# Patient Record
Sex: Female | Born: 1980 | Hispanic: Yes | Smoking: Never smoker
Health system: Southern US, Community
[De-identification: ages and names within clinical notes are randomized; demographics above are authoritative.]

## PROBLEM LIST (undated history)

## (undated) DIAGNOSIS — D649 Anemia, unspecified: Secondary | ICD-10-CM

## (undated) DIAGNOSIS — E119 Type 2 diabetes mellitus without complications: Secondary | ICD-10-CM

## (undated) DIAGNOSIS — Z5189 Encounter for other specified aftercare: Secondary | ICD-10-CM

## (undated) HISTORY — DX: Anemia, unspecified: D64.9

---

## 2005-02-16 ENCOUNTER — Ambulatory Visit (HOSPITAL_COMMUNITY): Admission: RE | Admit: 2005-02-16 | Discharge: 2005-02-16 | Payer: Self-pay | Admitting: Obstetrics and Gynecology

## 2005-03-25 ENCOUNTER — Ambulatory Visit (HOSPITAL_COMMUNITY): Admission: RE | Admit: 2005-03-25 | Discharge: 2005-03-25 | Payer: Self-pay | Admitting: *Deleted

## 2005-06-02 ENCOUNTER — Inpatient Hospital Stay (HOSPITAL_COMMUNITY): Admission: AD | Admit: 2005-06-02 | Discharge: 2005-06-02 | Payer: Self-pay | Admitting: *Deleted

## 2005-06-02 ENCOUNTER — Ambulatory Visit: Payer: Self-pay | Admitting: Obstetrics & Gynecology

## 2005-07-04 ENCOUNTER — Ambulatory Visit: Payer: Self-pay | Admitting: *Deleted

## 2005-07-04 ENCOUNTER — Inpatient Hospital Stay (HOSPITAL_COMMUNITY): Admission: AD | Admit: 2005-07-04 | Discharge: 2005-07-04 | Payer: Self-pay | Admitting: *Deleted

## 2005-07-07 ENCOUNTER — Inpatient Hospital Stay (HOSPITAL_COMMUNITY): Admission: AD | Admit: 2005-07-07 | Discharge: 2005-07-11 | Payer: Self-pay | Admitting: Obstetrics & Gynecology

## 2005-07-07 ENCOUNTER — Ambulatory Visit: Payer: Self-pay | Admitting: Obstetrics & Gynecology

## 2005-07-08 ENCOUNTER — Encounter (INDEPENDENT_AMBULATORY_CARE_PROVIDER_SITE_OTHER): Payer: Self-pay | Admitting: *Deleted

## 2009-02-13 ENCOUNTER — Emergency Department (HOSPITAL_COMMUNITY): Admission: EM | Admit: 2009-02-13 | Discharge: 2009-02-13 | Payer: Self-pay | Admitting: Emergency Medicine

## 2010-03-26 ENCOUNTER — Ambulatory Visit (HOSPITAL_COMMUNITY)
Admission: RE | Admit: 2010-03-26 | Discharge: 2010-03-26 | Payer: Self-pay | Source: Home / Self Care | Attending: Obstetrics & Gynecology | Admitting: Obstetrics & Gynecology

## 2010-07-22 LAB — URINALYSIS, ROUTINE W REFLEX MICROSCOPIC
Bilirubin Urine: NEGATIVE
Glucose, UA: NEGATIVE mg/dL
Ketones, ur: NEGATIVE mg/dL
Leukocytes, UA: NEGATIVE
Nitrite: NEGATIVE
Protein, ur: NEGATIVE mg/dL
Specific Gravity, Urine: 1.021 (ref 1.005–1.030)
Urobilinogen, UA: 0.2 mg/dL (ref 0.0–1.0)
pH: 5.5 (ref 5.0–8.0)

## 2010-07-22 LAB — WET PREP, GENITAL
Trich, Wet Prep: NONE SEEN
WBC, Wet Prep HPF POC: NONE SEEN
Yeast Wet Prep HPF POC: NONE SEEN

## 2010-07-22 LAB — URINE MICROSCOPIC-ADD ON

## 2010-07-22 LAB — GC/CHLAMYDIA PROBE AMP, GENITAL
Chlamydia, DNA Probe: NEGATIVE
GC Probe Amp, Genital: NEGATIVE

## 2010-07-22 LAB — PREGNANCY, URINE: Preg Test, Ur: NEGATIVE

## 2010-07-26 ENCOUNTER — Other Ambulatory Visit: Payer: Self-pay | Admitting: Family Medicine

## 2010-07-26 DIAGNOSIS — O480 Post-term pregnancy: Secondary | ICD-10-CM

## 2010-07-28 ENCOUNTER — Other Ambulatory Visit: Payer: Self-pay | Admitting: Family Medicine

## 2010-07-28 ENCOUNTER — Ambulatory Visit (HOSPITAL_COMMUNITY)
Admission: RE | Admit: 2010-07-28 | Discharge: 2010-07-28 | Disposition: A | Discharge status: Home / Self Care | Payer: Self-pay | Source: Ambulatory Visit | Attending: Family Medicine | Admitting: Family Medicine

## 2010-07-28 DIAGNOSIS — O480 Post-term pregnancy: Secondary | ICD-10-CM

## 2010-07-29 ENCOUNTER — Other Ambulatory Visit: Payer: Self-pay | Admitting: Obstetrics & Gynecology

## 2010-07-29 ENCOUNTER — Inpatient Hospital Stay (HOSPITAL_COMMUNITY)
Admission: AD | Admit: 2010-07-29 | Discharge: 2010-08-01 | DRG: 766 | Disposition: A | Discharge status: Home / Self Care | Payer: Medicaid Other | Source: Ambulatory Visit | Attending: Obstetrics & Gynecology | Admitting: Obstetrics & Gynecology

## 2010-07-29 DIAGNOSIS — O34219 Maternal care for unspecified type scar from previous cesarean delivery: Principal | ICD-10-CM | POA: Diagnosis present

## 2010-07-29 LAB — CBC
HCT: 41 % (ref 36.0–46.0)
Hemoglobin: 13.9 g/dL (ref 12.0–15.0)
MCH: 32.6 pg (ref 26.0–34.0)
MCHC: 33.9 g/dL (ref 30.0–36.0)
MCV: 96 fL (ref 78.0–100.0)
Platelets: 192 10*3/uL (ref 150–400)
RBC: 4.27 MIL/uL (ref 3.87–5.11)
RDW: 13.7 % (ref 11.5–15.5)
WBC: 9.2 10*3/uL (ref 4.0–10.5)

## 2010-07-29 LAB — RPR: RPR Ser Ql: NONREACTIVE

## 2010-07-30 LAB — CBC
HCT: 34.1 % — ABNORMAL LOW (ref 36.0–46.0)
Hemoglobin: 11.3 g/dL — ABNORMAL LOW (ref 12.0–15.0)
MCH: 32.1 pg (ref 26.0–34.0)
MCHC: 33.1 g/dL (ref 30.0–36.0)
MCV: 96.9 fL (ref 78.0–100.0)
Platelets: 168 10*3/uL (ref 150–400)
RBC: 3.52 MIL/uL — ABNORMAL LOW (ref 3.87–5.11)
RDW: 13.8 % (ref 11.5–15.5)
WBC: 11.8 10*3/uL — ABNORMAL HIGH (ref 4.0–10.5)

## 2010-08-02 ENCOUNTER — Ambulatory Visit (HOSPITAL_COMMUNITY): Payer: Self-pay

## 2010-08-03 ENCOUNTER — Other Ambulatory Visit: Payer: Self-pay

## 2010-08-05 NOTE — Discharge Summary (Signed)
  Nancy Romero, Nancy Romero          ACCOUNT NO.:  [id number]  MEDICAL RECORD NO.:  [id number]           PATIENT TYPE:  I  LOCATION:  9111                          [facility]  PHYSICIAN:  Lesly Dukes, M.D. DATE OF BIRTH:  Mar 21, 1981  DATE OF ADMISSION:  07/29/2010 DATE OF DISCHARGE:  08/01/2010                              DISCHARGE SUMMARY   OPERATIVE SURGEON:  Horton Chin, MD  DISCHARGE DIAGNOSIS:  Term pregnancy with repeat low transverse C- section.  DISCHARGE MEDICATIONS:  Prenatal vitamins and Percocet for pain.  PROCEDURES PERFORMED:  Repeat low transverse C-section for nonreactive NST during labor.  COMPLICATIONS FROM SURGERY:  None.  HOSPITAL COURSE:  The patient is a 30 year old, G2, P 2-0-0-2 with previous C-section who came in labor at 44 weeks, was found to have nonreassuring nonstress test, and was brought for repeat C-section which was performed without complications.  Her postoperative and postpartum course were normal.  She is breast-feeding her baby without difficulty, using the bathroom.  She has staples which will be removed at [facility] on Wednesday, August 04, 2010.  She is complaining of pain which is responding to Percocet from.  She also has a firm fundus and minimal vaginal bleeding and is at home status.  Plans for contraception are vasectomy for her husband.  Follow up with [facility] in 6 weeks. Baby will follow up with [facility].  DISCHARGE ACTIVITY:  Ad lib.    ______________________________ Edd Arbour, MD   ______________________________ Lesly Dukes, M.D.    JO/MEDQ  D:  08/01/2010  T:  08/01/2010  Job:  [id number]  Electronically Signed by Edd Arbour MD on 08/03/2010 09:50:26 PM Electronically Signed by Elsie Lincoln M.D. on 08/05/2010 10:23:55 AM

## 2010-08-08 ENCOUNTER — Inpatient Hospital Stay (HOSPITAL_COMMUNITY)
Admission: AD | Admit: 2010-08-08 | Discharge: 2010-08-08 | Disposition: A | Discharge status: Home / Self Care | Payer: Self-pay | Source: Ambulatory Visit | Attending: Obstetrics & Gynecology | Admitting: Obstetrics & Gynecology

## 2010-08-08 DIAGNOSIS — O2390 Unspecified genitourinary tract infection in pregnancy, unspecified trimester: Secondary | ICD-10-CM

## 2010-08-08 DIAGNOSIS — B3789 Other sites of candidiasis: Secondary | ICD-10-CM | POA: Insufficient documentation

## 2010-08-08 DIAGNOSIS — R109 Unspecified abdominal pain: Secondary | ICD-10-CM | POA: Insufficient documentation

## 2010-08-08 LAB — URINALYSIS, ROUTINE W REFLEX MICROSCOPIC
Bilirubin Urine: NEGATIVE
Glucose, UA: NEGATIVE mg/dL
Ketones, ur: NEGATIVE mg/dL
Protein, ur: NEGATIVE mg/dL
Specific Gravity, Urine: 1.02 (ref 1.005–1.030)
pH: 6 (ref 5.0–8.0)

## 2010-08-08 LAB — URINE MICROSCOPIC-ADD ON

## 2010-08-08 LAB — CBC
HCT: 39.9 % (ref 36.0–46.0)
Hemoglobin: 13.5 g/dL (ref 12.0–15.0)
MCH: 32.8 pg (ref 26.0–34.0)
MCHC: 33.8 g/dL (ref 30.0–36.0)
MCV: 97.1 fL (ref 78.0–100.0)
Platelets: 294 10*3/uL (ref 150–400)
RBC: 4.11 MIL/uL (ref 3.87–5.11)
RDW: 13.3 % (ref 11.5–15.5)
WBC: 8.6 10*3/uL (ref 4.0–10.5)

## 2010-08-09 NOTE — Op Note (Signed)
NAMEAHRIANNA, SIGLIN          ACCOUNT NO.:  [id number]  MEDICAL RECORD NO.:  [id number]           PATIENT TYPE:  I  LOCATION:  9111                          [facility]  PHYSICIAN:  Horton Chin, MD DATE OF BIRTH:  [birth year]  DATE OF PROCEDURE:  07/29/2010 DATE OF DISCHARGE:                              OPERATIVE REPORT   PREOPERATIVE DIAGNOSES: 1. Intrauterine pregnancy at 71 and 4/7th weeks' gestation. 2. Nonreactive nonstress test. 3. Prior cesarean section. 4. The patient desires elective repeat cesarean section and declines     trial of labor after cesarean section.  POSTOPERATIVE DIAGNOSES: 1. Intrauterine pregnancy at 62 and 4/7th weeks' gestation. 2. Nonreactive nonstress test. 3. Prior cesarean section. 4. The patient desires elective repeat cesarean section and declines     trial of labor after cesarean section.  PROCEDURE:  Repeat low transverse cesarean section via Pfannenstiel incision.  SURGEON:  Horton Chin, MD  ANESTHESIOLOGIST:  Cristela Blue.  ANESTHESIA:  Spinal.  IV FLUIDS:  2100 mL of lactated Ringer.  ESTIMATED BLOOD LOSS:  500 mL.  URINE OUTPUT:  275 mL.  INDICATIONS:  The patient is a 30 year old gravida 2, para 1 at 40-4/7th week gestation who [facility].  The patient's prenatal course is remarkable for history of a prior cesarean section. The patient originally consented for a trial of labor after cesarean section.  However, she recently changed her mind and wanted an elective repeat cesarean section when [facility] her [facility] visits.  Also at the [facility], she was noted to have a nonreactive NST.  She was sent to MAU for further monitoring and after 2 hours of being on the monitor, she still did not have a reactive NST. Given that this was an indication to move towards delivery and the patient desired elective cesarean section, she was counseled  regarding the risks of the cesarean section.  These included but are not limited to: bleeding which might require transfusion, infection, which require antibiotics, injury to surrounding organs, need for additional procedures including hysterectomy in the event of life-threatening hemorrhage, thromboembolic phenomenon, incisional problems, abnormal presentation, and subsequent pregnancies and other postoperative anesthesia, complications written informed consent was obtained.  Of note, counseling was done with the help of a Spanish interpreter.  FINDINGS:  Viable female infant in cephalic presentation.  Apgars were 9 and 9 weight 8 pounds and 6 ounces.  There was clear amniotic fluid, intact placenta with three-vessel cord.  Normal uterus and bilateral adnexa.  SPECIMENS:  Placenta was sent to Pathology.  COMPLICATIONS:  None immediate.  PROCEDURE DETAIL:  The patient was taken to the operating room where spinal analgesia was administered and found to be adequate.  She was then placed in a dorsal supine position with a leftward tilt, and prepped and draped in a sterile manner.  Sequential compression devices were applied to her lower extremities and a Foley catheter was inserted into the patient's bladder and attached to constant gravity.  The patient also received preoperative antibiotics.  After an adequate time-out was performed a Pfannenstiel incision was made over her  preexisting scar and taken down to the layer of fascia.  This fascial incision was incised bilaterally.  Kochers were placed on both sides and the fascial incision on the underlying rectus muscle was dissected off sharply and bluntly. The rectus muscles were separated in the midline, bluntly and sharply and the peritoneum was also entered.  This incision was extended superiorly and inferiorly with good visualization of bowel and bladder.   Attention was turned to the patient's lower uterine segment where a  transverse hysterotomy was made and extended in a blunt fashion. Amniotic fluid membranes were ruptured for clear fluid.  The infant's head was encountered and delivered atraumatically.  The rest of the infant was also delivered using fundal pressure.  Cord was clamped and cut, and the infant was handed over to the awaiting Neonatology team. Cord blood was collected according to the protocol.  Fundal massage was administered and the placenta delivered intact with three-vessel cord. The uterus was cleared of all clots and debris using dry laparotomy sponges.  The hysterotomy was repaired using 0 Monocryl in a running interlocking fashion.  A second layer 0 Monocryl was used for an imbricating layer.  Good hemostasis was noted.  The pelvis and gutters were cleared of all clot and debris.  The muscles and peritoneum were closed in a mass fashion using 3 interrupted sutures of 0 Monocryl.  The fascia was reapproximated using 0 PDS in a running fashion.  The subcutaneous layer was irrigated and reapproximated using 3 interrupted stitches of 2-0 plain gut, and the skin was closed with 3-0 Vicryl subcuticular stitch.  The patient tolerated the procedure well.  Sponge, instrument, and needle counts were correct x2.  She was taken to recovery room in a stable condition.     Horton Chin, MD     UAA/MEDQ  D:  07/29/2010  T:  07/30/2010  Job:  [id number]  Electronically Signed by Jaynie Collins MD on 08/09/2010 12:13:30 PM

## 2010-09-03 NOTE — Op Note (Signed)
Nancy Romero, Nancy Romero               ACCOUNT NO.:  [id number]   MEDICAL RECORD NO.:  [id number]          PATIENT TYPE:  INP   LOCATION:  9121                          [facility]   PHYSICIAN:  Lesly Dukes, M.D. DATE OF BIRTH:  1981-02-25   DATE OF PROCEDURE:  07/08/2005  DATE OF DISCHARGE:                                 OPERATIVE REPORT   PREOPERATIVE DIAGNOSES:  1.  Intrauterine pregnancy at 40 weeks and 3 days.  2.  Large for gestational age.  3.  Requesting primary cesarean section.   POSTOPERATIVE DIAGNOSES:  1.  Intrauterine pregnancy at 40 weeks and 3 days.  2.  Large for gestational age.  3.  Requesting primary cesarean section.   OPERATION/PROCEDURE:  Primary low transverse cesarean section via  Pfannenstiel.   SURGEON:  Lesly Dukes, M.D.   ASSISTANTKaroline Caldwell B. Merlene Morse, M.D.   ANESTHESIA:  Spinal.   COMPLICATIONS:  None.   ESTIMATED BLOOD LOSS:  600 mL.   FLUIDS:  3400 mL.   URINARY OUTPUT:  200 mL of clear urine at the end of the procedure.   INDICATIONS:  A 30 year old, gravida 1, para 0 at 40 weeks and 3 days with  nonreactive NST with LGA requesting primary cesarean section.   FINDINGS:  Female infant without presentation.  Terminal meconium.  Peds were  at delivery.  Apgars 8 and 9. Weight 8 pounds 5 ounces.  Normal uterus,  tubes and ovaries.  Hypospiral cord.  Cord pH 7.27.   DESCRIPTION OF PROCEDURE:  The patient was taken to the operating room where  spinal anesthesia was given and found to be adequate.  She was then prepped  and draped in the normal sterile fashion in the dorsal supine position with  a leftward tilt.  A Pfannenstiel skin incision was then made with the  scalpel and carried through to the underlying layer of fascia.  The fascia  was incised in the midline and incision extended laterally with the Mayo  scissors.  The superior aspect of the fascial incision was then grasped with  the Kocher clamps, elevated and the  underlying rectus muscle dissected off  bluntly.  Attention was then turned to the inferior aspect of the incision  which in a similar fashion was grasped, tented up with Kocher clamps and the  rectus muscle dissected off bluntly.  The rectus muscle was then separated  in the midline and the peritoneum identified, tented up and entered sharply  with the Metzenbaum scissors.  The peritoneal incision was extended  superiorly and inferiorly with good visualization of the bladder.  Bladder  blade was then inserted into the vesicouterine peritoneum, identified and  grasped with pickups and entered sharply with the Metzenbaum scissors.  Incision was extended laterally and the bladder flap created digitally.  The  bladder blade was then reinserted in the lower uterine segment, incised in  the transverse fashion with the scalpel.  The uterus was then extended  laterally with the bandage scissors.  Bladder blade was removed and the  infant's head delivered atraumatically.  The nose and mouth  were suctioned  with a bulb syringe.  The cord was clamped and cut.  The infant was handed  off to the awaiting pediatricians.  Cord blood were sent.  Cord pH 7.27.  Placenta was then removed manually.  A hypospiral cord was noted.  The  uterus was then exteriorized and cleared of all clots and debris.  The  uterine incision was repaired in a running locked fashion.  The gutters were  cleared off of all clots and the fascia was reapproximated in a running  fashion.  The skin was closed with staples.  The patient tolerated the  procedure well.  Sponge, lap and needle counts were correct x2.  Ancef 2 g  was given at cord clamp.  The patient was taken to the recovery room in  stable condition.     ______________________________  August Saucer Merlene Morse, MD    ______________________________  Lesly Dukes, M.D.    ABC/MEDQ  D:  07/08/2005  T:  07/11/2005  Job:  [id number]

## 2010-09-03 NOTE — Discharge Summary (Signed)
Nancy Romero, UPHAM               ACCOUNT NO.:  [id number]   MEDICAL RECORD NO.:  [id number]          PATIENT TYPE:  INP   LOCATION:  9121                          [facility]   PHYSICIAN:  Conni Elliot, M.D.DATE OF BIRTH:  05-17-1980   DATE OF ADMISSION:  07/07/2005  DATE OF DISCHARGE:  07/11/2005                                 DISCHARGE SUMMARY   DISCHARGE DIAGNOSIS:  Post dates pregnancy, delivered via primary elective  low-transverse cesarean section due to estimated gestational weight greater  than the 95th percentile.   DISCHARGE MEDICATIONS:  1.  Ibuprofen 600 mg q.6 p.r.n.  2.  Percocet 5/325 q.6 p.r.n.  3.  Micronor one pill at the same time daily to be taken as directed.  4.  Colace b.i.d. as needed for constipation.  5.  Prenatal vitamin to be taken daily.   PROCEDURES:  Primary elective low-transverse cesarean section.   CONSULTS:  None.   LABORATORY:  Laboratory data from July 09, 2005 are as follows:  WBC 13.8,  hemoglobin 10.2, hematocrit 29.5 and platelets 185,000.   HOSPITAL COURSE:  The patient is a 30 year old G1, P0 who presented to the  MAU at 40 and 2/7 weeks, who was sent in secondary to a nonreactive NST in  the office.  She, on arrival at the MAU, had a BPP which showed a 6/10 and  since she was post dates, it was decided to induce her with Cervidil.  However, she had a growth ultrasound in the MAU, which showed that her fetus  was greater than the 95th percentile.  Due to this, the patient was informed  of the risk of shoulder dystocia with a vaginal delivery and the patient  requested a primary elective cesarean section secondary to these risks.  The  patient was then explained the risks of cesarean section, which include but  are not limited to bleeding, infection and damage to intraabdominal organs.  The patient accepted these risks and C-section was scheduled for the  afternoon of July 08, 2005.  At 1518 on July 08, 2005, a viable  female  infant was delivered with Apgars of 8 at one and 9 at five.  The placenta  was delivered at 1519 without complication.  For details on the cesarean  section, please see op note, which is dictation [phone].  Postpartum course  was uncomplicated and the patient was able to be discharged home on postop  day 3 without complication.  The patient is O positive blood type, which  does not necessitate RhoGAM.  However, she is rubella nonimmune and will be  vaccinated prior to discharge.  The patient is both breast and bottle  feeding her infant and will be discharged on prenatal vitamins.  The patient  does not desire a circumcision at this time.  The patient will have her  staples removed on postop day #5, which will be Wednesday, March 28, at home  with a nurse from the [facility].  The patient has  voiced, on many occasions, that she does not desire contraception.  However,  when speaking  with her nurse via our Spanish interpreter, she stated that if  she used anything, she would use the pill.  Due  to this and the fact that she is breastfeeding, she will be given a  prescription for Micronor and instructed to take this prescription at the  same time every day or it will not be effective.  The patient will have  follow up appointment at [facility] in 6 weeks and she was discharged  home in stable condition without complication.      Neena Rhymes, M.D.    ______________________________  Conni Elliot, M.D.    KT/MEDQ  D:  07/11/2005  T:  07/12/2005  Job:  [id number]

## 2011-06-17 ENCOUNTER — Emergency Department (HOSPITAL_COMMUNITY)
Admission: EM | Admit: 2011-06-17 | Discharge: 2011-06-17 | Disposition: A | Discharge status: Home / Self Care | Payer: Self-pay | Attending: Emergency Medicine | Admitting: Emergency Medicine

## 2011-06-17 ENCOUNTER — Encounter (HOSPITAL_COMMUNITY): Payer: Self-pay

## 2011-06-17 DIAGNOSIS — R599 Enlarged lymph nodes, unspecified: Secondary | ICD-10-CM | POA: Insufficient documentation

## 2011-06-17 DIAGNOSIS — R5381 Other malaise: Secondary | ICD-10-CM | POA: Insufficient documentation

## 2011-06-17 DIAGNOSIS — J039 Acute tonsillitis, unspecified: Secondary | ICD-10-CM | POA: Insufficient documentation

## 2011-06-17 LAB — RAPID STREP SCREEN (MED CTR MEBANE ONLY): Streptococcus, Group A Screen (Direct): NEGATIVE

## 2011-06-17 MED ORDER — SODIUM CHLORIDE 0.9 % IV SOLN
INTRAVENOUS | Status: DC
  Administered 2011-06-17: 20:00:00 via INTRAVENOUS

## 2011-06-17 MED ORDER — CLINDAMYCIN HCL 150 MG PO CAPS: 150.0000 mg | ORAL_CAPSULE | Freq: Four times a day (QID) | ORAL | Status: AC

## 2011-06-17 MED ORDER — CLINDAMYCIN PHOSPHATE 600 MG/50ML IV SOLN
600.0000 mg | Freq: Once | INTRAVENOUS | Status: AC
  Administered 2011-06-17: 600 mg via INTRAVENOUS
  Filled 2011-06-17: qty 50

## 2011-06-17 NOTE — ED Provider Notes (Signed)
History     CSN: [id number]  Arrival date & time 06/17/11  1541   First MD Initiated Contact with Patient 06/17/11 1909      Chief Complaint  Patient presents with  . Abscess    peri tonsilar to LT side x 4 days.  sent by UC.    (Consider location/radiation/quality/duration/timing/severity/associated sxs/prior treatment) HPI Comments: Sent from urgent care for possible peritonsillar abscess. No dysphagia or odynophagia.  Patient is a 31 y.o. female presenting with pharyngitis. The history is provided by the patient. No language interpreter was used.  Sore Throat This is a new problem. The current episode started more than 2 days ago (4 days ago). The problem occurs constantly. The problem has been gradually worsening. Pertinent negatives include no chest pain, no abdominal pain, no headaches and no shortness of breath. The symptoms are aggravated by swallowing. The symptoms are relieved by nothing. She has tried nothing for the symptoms.    No past medical history on file.  Past Surgical History  Procedure Date  . Cesarean section     x2    No family history on file.  History  Substance Use Topics  . Smoking status: Never Smoker   . Smokeless tobacco: Not on file  . Alcohol Use: No    OB History    Grav Para Term Preterm Abortions TAB SAB Ect Mult Living                  Review of Systems  Constitutional: Positive for activity change and fatigue. Negative for fever, chills and appetite change.  HENT: Positive for sore throat. Negative for congestion, rhinorrhea, neck pain and neck stiffness.   Respiratory: Negative for cough and shortness of breath.   Cardiovascular: Negative for chest pain and palpitations.  Gastrointestinal: Negative for nausea, vomiting and abdominal pain.  Genitourinary: Negative for dysuria, urgency, frequency and flank pain.  Musculoskeletal: Negative for myalgias, back pain and arthralgias.  Neurological: Negative for dizziness, weakness,  light-headedness, numbness and headaches.  All other systems reviewed and are negative.    Allergies  Review of patient's allergies indicates no known allergies.  Home Medications   Current Outpatient Rx  Name Route Sig Dispense Refill  . AMPICILLIN 250 MG PO CAPS Oral Take 250 mg by mouth 4 (four) times daily.    . IBUPROFEN 200 MG PO TABS Oral Take 200 mg by mouth every 6 (six) hours as needed. For pain relief    . ADULT MULTIVITAMIN W/MINERALS CH Oral Take 1 tablet by mouth daily.    Marland Kitchen CLINDAMYCIN HCL 150 MG PO CAPS Oral Take 1 capsule (150 mg total) by mouth every 6 (six) hours. 28 capsule 0    BP 125/70  Pulse 86  Resp 16  Ht 5\' 2"  (1.575 m)  Wt 192 lb (87.091 kg)  BMI 35.12 kg/m2  SpO2 100%  LMP 05/27/2011  Physical Exam  Nursing note and vitals reviewed. Constitutional: She is oriented to person, place, and time. She appears well-developed and well-nourished. No distress.  HENT:  Head: Normocephalic and atraumatic.  Mouth/Throat: No oropharyngeal exudate.       Mild tonsillitis - no evidence of PTA  Eyes: Conjunctivae and EOM are normal. Pupils are equal, round, and reactive to light.  Neck: Normal range of motion. Neck supple.  Cardiovascular: Normal rate, regular rhythm, normal heart sounds and intact distal pulses.  Exam reveals no gallop and no friction rub.   No murmur heard. Pulmonary/Chest: Effort normal and  breath sounds normal. No respiratory distress.  Abdominal: Soft. Bowel sounds are normal. There is no tenderness.  Musculoskeletal: Normal range of motion. She exhibits no tenderness.  Lymphadenopathy:    She has cervical adenopathy (mild).  Neurological: She is alert and oriented to person, place, and time. No cranial nerve deficit.  Skin: Skin is warm. No rash noted.    ED Course  Procedures (including critical care time)   Labs Reviewed  RAPID STREP SCREEN   No results found.   1. Tonsillitis       MDM  Mild tonsillitis. There is no  evidence of peritonsillar abscess. Received a dose of IV clindamycin. Received Decadron at the urgent care prior to arrival therefore was not repeated. She'll be discharged home with instructions to return if worse.        Dayton Bailiff, MD 06/17/11 2108

## 2011-06-17 NOTE — ED Notes (Signed)
NAD noted at this time

## 2011-06-17 NOTE — Discharge Instructions (Signed)
Amigdalitis  (Tonsillitis)  Las amgdalas son bultos de tejido linftico que se encuentran el la zona posterior de la garganta. Cada amgdala tiene 20 grietas (criptas). Ayudan a Industrial/product designer las infecciones de la nariz y la garganta y a Automotive engineer que las infecciones se diseminen a otras zonas del [place] los primeros 18 meses de vida. La amigdalitis es una infeccin de la garganta que hace que las amgdalas se tornen rojas, sensibles e hinchadas. CAUSAS  La causa de la amigdalitis sbita(aguda), y con tratamiento temporaria, es una infeccin por la bacteria estreptococo. La amigdalitis de larga duracin (crnica) se produce cuando las grietas de las amigdalas se llenan con trozos de alimentos y bacterias, que favorece las infecciones constantes.  SNTOMAS  Los sntomas son:   Dolor de Advertising copywriter.   Placas blancas sobre la amgdala.   Grant Ruts.   Cansancio.  DIAGNSTICO  El diagnstico puede hacerse a travs de un examen fsico. Se confirma con los resultados de las pruebas de laboratorio, incluyendo un cultivo de secreciones de Administrator.  TRATAMIENTO  Los objetivos del tratamiento son la reduccin de la gravedad y duracin de los sntomas, prevencin de enfermedades asociadas y prevencin del contagio de la enfermedad. La amigdalitis bacteriana se puede tratar con antibiticos. Generalmente,el tratamiento con antibiticos comienza antes de conocerse la causa. Sin embargo, si se determina que la causa no es bacteriana, los antibiticos no podrn Barista. Si los ataques de amigdalitis son graves y frecuentes, Administrator la ciruga para extirpar las amgdalas (amigdalectoma).  INSTRUCCIONES PARA EL CUIDADO EN EL HOGAR   Descanse y duerma todo lo posible.   Beba lquido en abundancia. Mientras le duela la garganta, consuma alimentos blandos o lquidos, como sorbetes, sopas, o bebidas instantneas.   Tome helados de agua.   Los nios [employer] o los adultos  pueden hacerse grgaras con lquidos tibios o fros para Personal assistant. Mezcle 1 cucharadita de sal en 1 taza de agua.   Los [employer] de la familia que presenten dolor de garganta o fiebre deben concurrir para un examen mdico o realizarse un cultivo de las secreciones de la garganta.   Solo tome medicamentos que se pueden comprar sin receta o recetados para el dolor, Dentist o fiebre, como le indica el mdico.  SOLICITE ATENCIN MDICA SI:   El beb tiene ms de 3 meses y su temperatura rectal es de 100.5 F (38.1 C) o ms durante ms de 1 da.   Le aparecen bultos grandes y dolorosos en el cuello.   Tiene una erupcin.   Elimina un esputo verde, marrn-amarillento o sanguinolento.   No puede tragar lquidos o alimentos durante 24 horas.   El nio no puede tragar lquidos o alimentos durante 12 horas.  SOLICITE ATENCIN MDICA DE INMEDIATO SI:   Presenta algn nuevo sntoma, como vmitos, dolor de odos, dolor de cabeza intenso, rigidez o dolor en el cuello, dolor en el pecho o problemas respiratorios o dificultad para tragar.   Comienza a Financial risk analyst de garganta ms intenso junto con babeo o cambios en la voz.   Siente un dolor intenso, que no se alivia con los [employer] han prescripto.   No puede abrir completamene la boca.   Siente un dolor intenso, hinchazn o enrojecimiento en el cuello.   Tiene fiebre.   Su beb tiene ms de 3 meses y su temperatura rectal es de 102 F (38.9 C) o ms.   Su beb tiene 3 meses o  menos y su temperatura rectal es de 100.4 F (38 C) o ms.  ASEGRESE DE QUE:   Comprende estas instrucciones.   Controlar su enfermedad.   Solicitar ayuda de inmediato si no mejora o empeora.  Document Released: 01/12/2005 Document Revised: 12/15/2010 [facility] Patient Information 2012 [place], [place].

## 2012-01-16 IMAGING — US US OB DETAIL+14 WK
1 of 2 series · 12 of 28 positions shown · non-contrast
Comparison: none

[Series 1: us ob detail +14 wk · 0.27mm/px · 59 acquisitions, 12 frames shown]
[im 3/59]
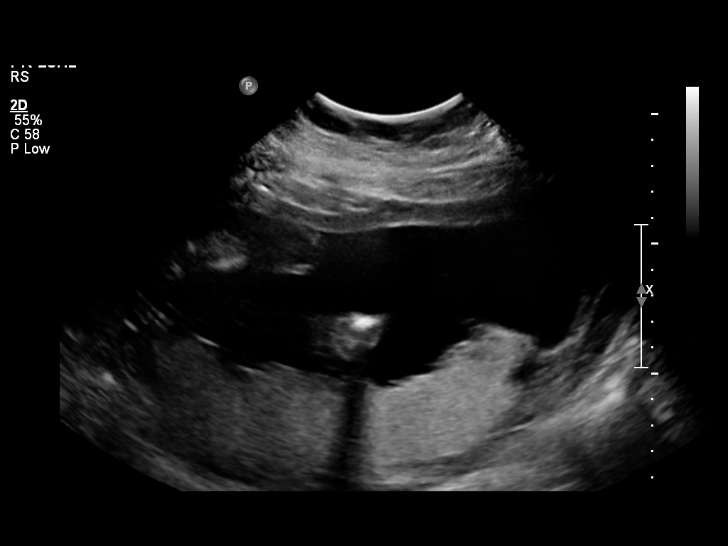
[im 7/59]
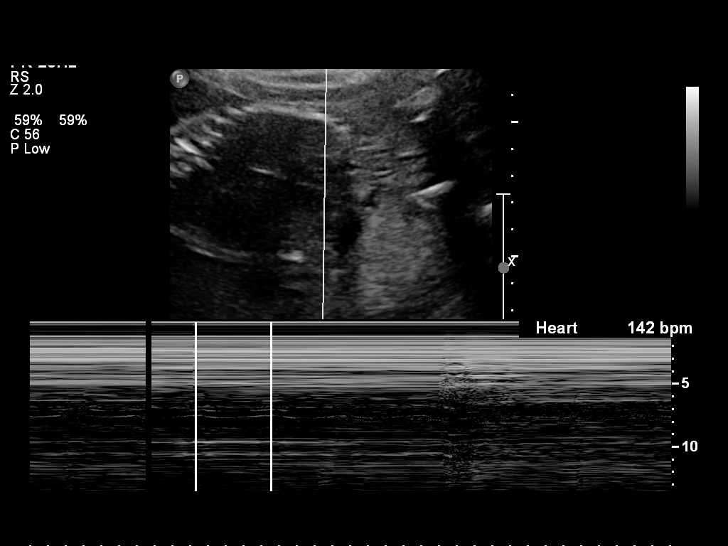
[im 12/59]
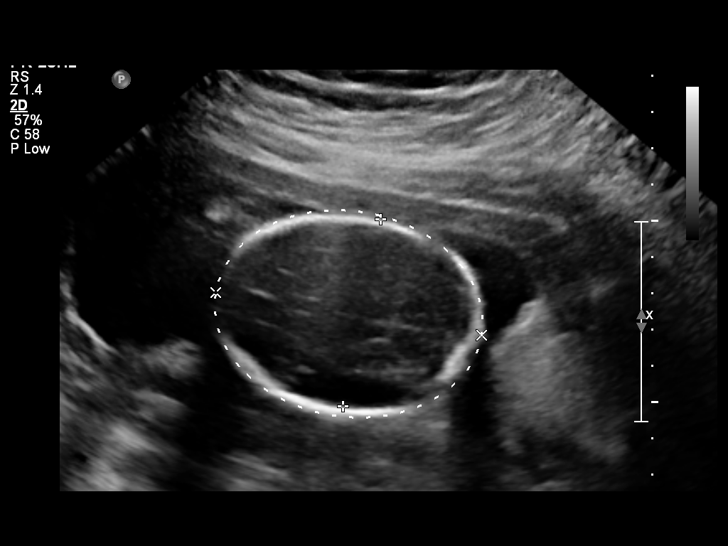
[im 18/59]
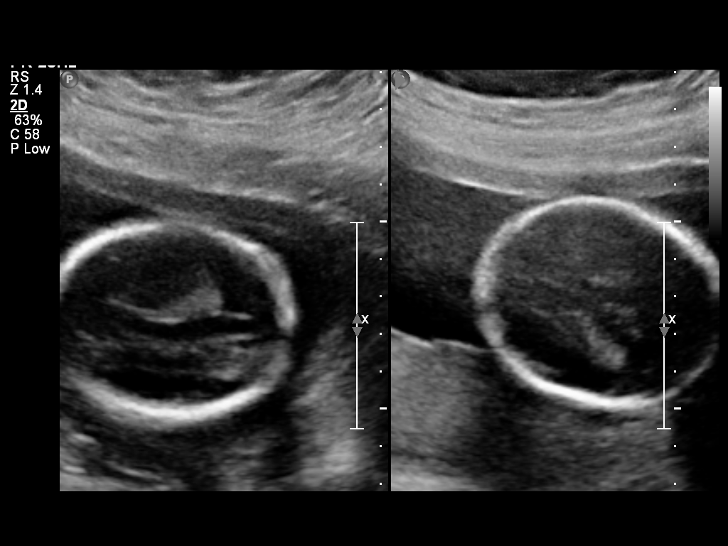
[im 23/59]
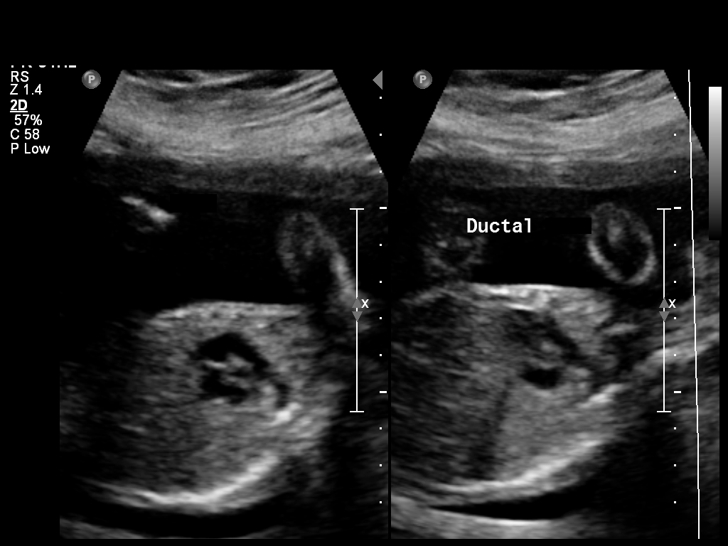
[im 27/59]
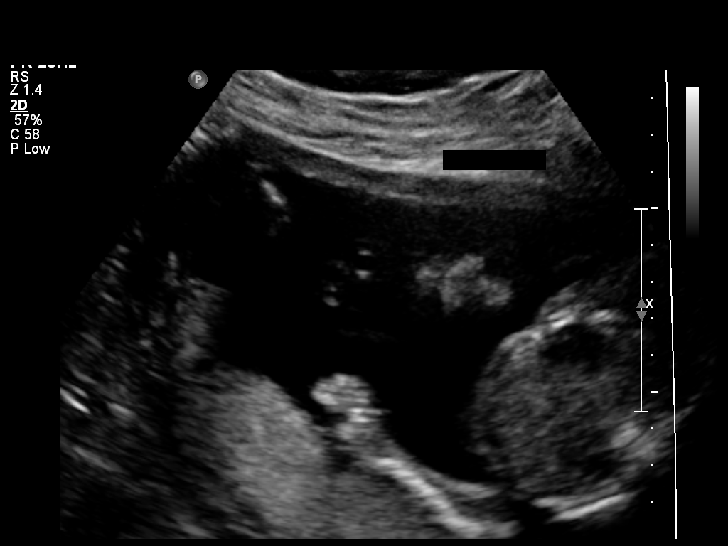
[im 34/59]
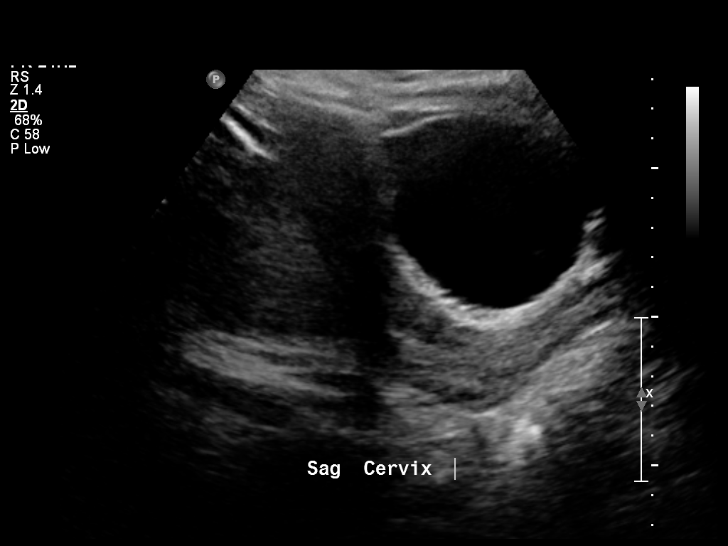
[im 38/59]
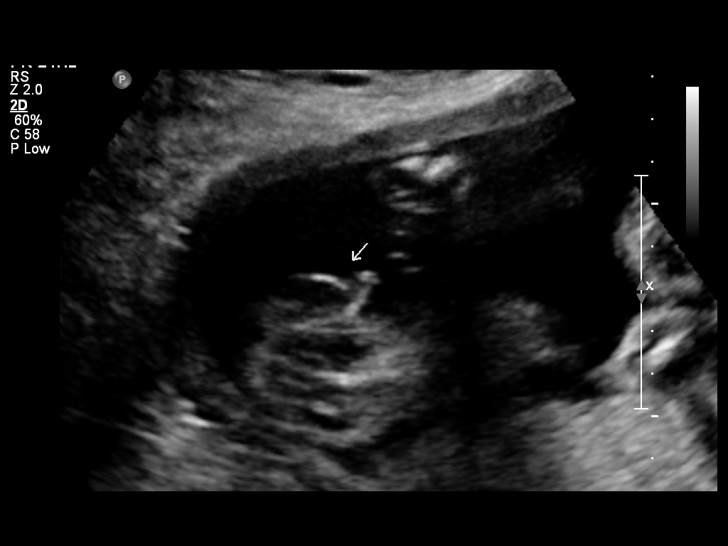
[im 43/59]
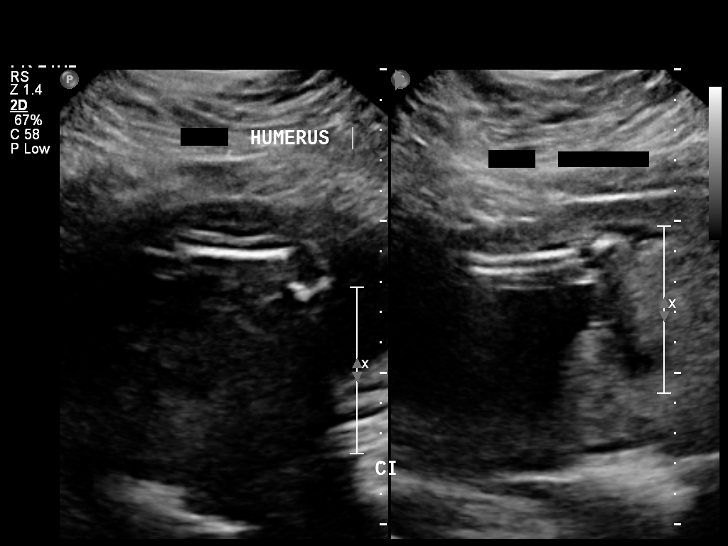
[im 50/59]
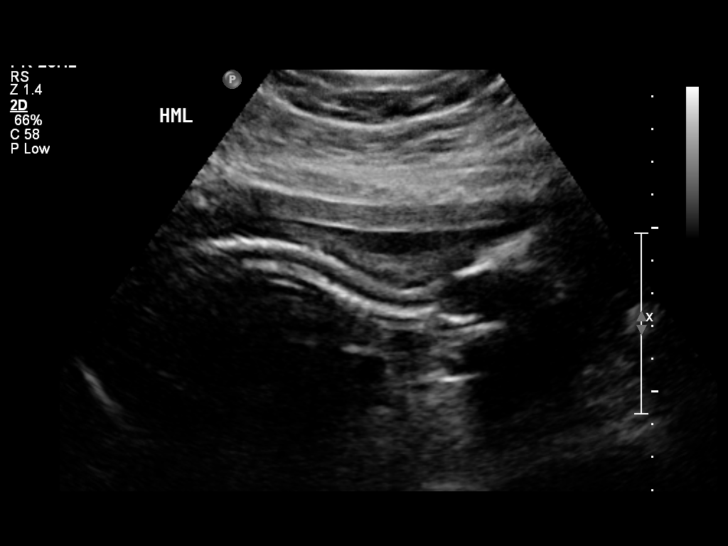
[im 54/59]
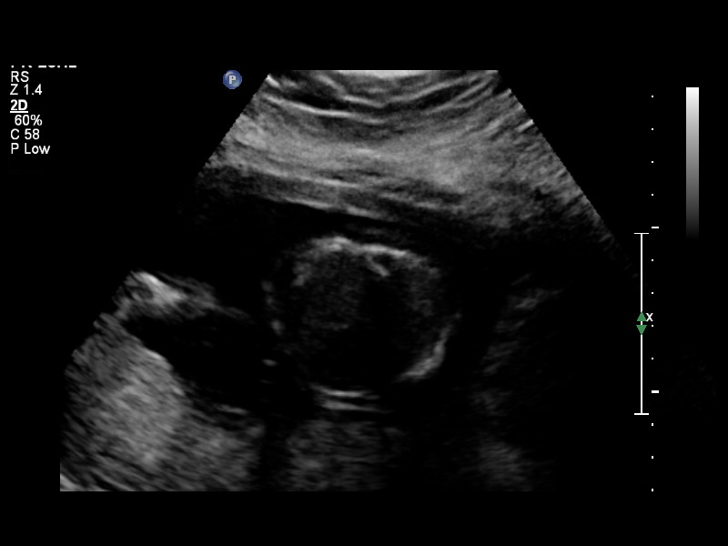
[im 59/59]
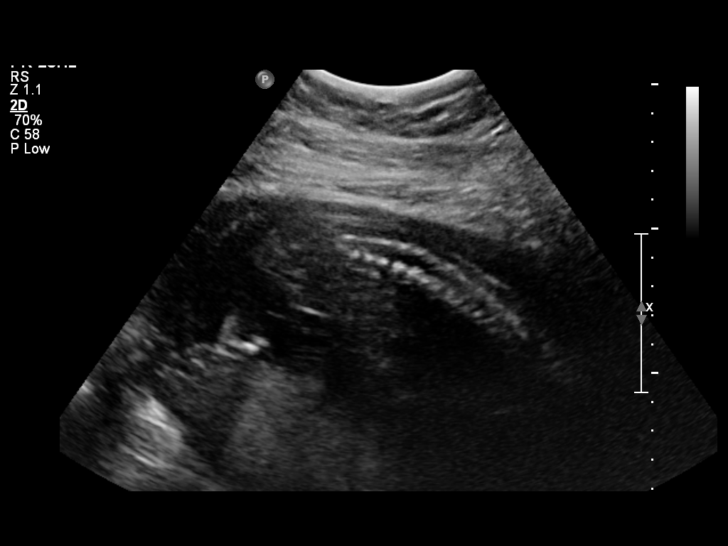

[12 of 28 positions shown; findings below may reference images not displayed]

OBSTETRICS REPORT
                      (Signed Final 03/26/2010 [DATE])

                 [REDACTED]-
                 Faculty Physician
 Order#:         _O
Procedures

 US OB DETAIL +14 WK                                   76811.0
Indications

 Detailed fetal anatomic survey
 Confirm dates
Fetal Evaluation

 Fetal Heart Rate:  142                          bpm
 Cardiac Activity:  Observed
 Presentation:      Variable
 Placenta:          Posterior, above cervical
                    os
 P. Cord            Visualized
 Insertion:

 Amniotic Fluid
 AFI FV:      Subjectively within normal limits
                                             Larg Pckt:     4.7  cm
Biometry

 BPD:     52.4  mm     G. Age:  21w 6d                CI:        66.17   70 - 86
                                                      FL/HC:      20.2   19.2 -

 HC:     206.6  mm     G. Age:  22w 5d       39  %    HC/AC:      1.15   1.05 -

 AC:     180.3  mm     G. Age:  22w 6d       48  %    FL/BPD:     79.6   71 - 87
 FL:      41.7  mm     G. Age:  23w 4d       67  %    FL/AC:      23.1   20 - 24
 HUM:     37.2  mm     G. Age:  23w 0d       51  %

 Est. FW:     561  gm      1 lb 4 oz     57  %
Gestational Age

 LMP:           22w 5d        Date:  10/18/09                 EDD:   07/25/10
 U/S Today:     22w 5d                                        EDD:   07/25/10
 Best:          22w 5d     Det. By:  LMP  (10/18/09)          EDD:   07/25/10
Anatomy

 Cranium:           Appears normal      Aortic Arch:       Appears normal
 Fetal Cavum:       Appears normal      Ductal Arch:       Appears normal
 Ventricles:        Appears normal      Diaphragm:         Appears normal
 Choroid Plexus:    Appears normal      Stomach:           Appears normal
 Cerebellum:        Appears normal      Abdomen:           Appears normal
 Posterior Fossa:   Appears normal      Abdominal Wall:    Appears nml
                                                           (cord insert,
                                                           abd wall)
 Nuchal Fold:       Not applicable      Cord Vessels:      Appears normal
                    (>20 wks GA)                           (3 vessel cord)
 Face:              Appears normal      Kidneys:           Appear normal
                    (lips/profile/orbit
                    s)
 Heart:             Appears normal      Bladder:           Appears normal
                    (4 chamber &
                    axis)
 RVOT:              Appears normal      Spine:             Appears normal
 LVOT:              Appears normal      Limbs:             Appears normal
                                                           (hands, ankles,
                                                           feet)

 Other:     Male gender. Heels and 5th digit visualized. Nasal bone
            visualized.
Cervix Uterus Adnexa

 Cervical Length:    3.89     cm

 Cervix:       Closed.

 Adnexa:     No abnormality visualized.
Impression

 Single intrauterine gestation demonstrating an estimated
 gestational age by ultrasound of 22w 5d  . This correlates
 well with expected EGA by LMP of 22w 5d  .

 No focal fetal or placental abnormalities are noted with a
 good anatomic evaluation possible. No soft markers for Down
 Syndrome are seen. Sonographic modification of Down
 Syndrome risk was not performed as the patient is beyond
 the EGA at which this assessment is typically performed.

 Subjectively and quantitatively normal amniotic fluid volume.
 Normal cervical length and appearance.

## 2013-10-02 ENCOUNTER — Encounter: Payer: Self-pay | Admitting: *Deleted

## 2013-10-29 ENCOUNTER — Encounter (HOSPITAL_COMMUNITY): Payer: Self-pay | Admitting: *Deleted

## 2013-10-29 ENCOUNTER — Inpatient Hospital Stay (HOSPITAL_COMMUNITY)
Admission: AD | Admit: 2013-10-29 | Discharge: 2013-10-29 | Disposition: A | Discharge status: Home / Self Care | Payer: Medicaid Other | Source: Ambulatory Visit | Attending: Obstetrics and Gynecology | Admitting: Obstetrics and Gynecology

## 2013-10-29 ENCOUNTER — Inpatient Hospital Stay (HOSPITAL_COMMUNITY): Payer: Medicaid Other

## 2013-10-29 DIAGNOSIS — N949 Unspecified condition associated with female genital organs and menstrual cycle: Secondary | ICD-10-CM | POA: Insufficient documentation

## 2013-10-29 DIAGNOSIS — R109 Unspecified abdominal pain: Secondary | ICD-10-CM | POA: Insufficient documentation

## 2013-10-29 DIAGNOSIS — R52 Pain, unspecified: Secondary | ICD-10-CM

## 2013-10-29 DIAGNOSIS — L905 Scar conditions and fibrosis of skin: Secondary | ICD-10-CM

## 2013-10-29 LAB — URINALYSIS, ROUTINE W REFLEX MICROSCOPIC
Bilirubin Urine: NEGATIVE
GLUCOSE, UA: NEGATIVE mg/dL
Hgb urine dipstick: NEGATIVE
Ketones, ur: NEGATIVE mg/dL
LEUKOCYTES UA: NEGATIVE
Nitrite: NEGATIVE
PH: 6 (ref 5.0–8.0)
PROTEIN: NEGATIVE mg/dL
SPECIFIC GRAVITY, URINE: 1.025 (ref 1.005–1.030)
Urobilinogen, UA: 0.2 mg/dL (ref 0.0–1.0)

## 2013-10-29 LAB — POCT PREGNANCY, URINE: PREG TEST UR: NEGATIVE

## 2013-10-29 MED ORDER — CYCLOBENZAPRINE HCL 10 MG PO TABS: 10.0000 mg | ORAL_TABLET | Freq: Two times a day (BID) | ORAL | Status: DC | PRN

## 2013-10-29 NOTE — MAU Provider Note (Signed)
Attestation of Attending Supervision of Advanced Practitioner (CNM/NP): Evaluation and management procedures were performed by the Advanced Practitioner under my supervision and collaboration.  I have reviewed the Advanced Practitioner's note and chart, and I agree with the management and plan.  Cidney Kirkwood 10/29/2013 12:43 PM

## 2013-10-29 NOTE — MAU Note (Signed)
C/S 3 1/2 years ago, has been having pain @ C/S site for the last 8 months.  Has swelling @ site, has "balls" at scar, very painful.

## 2013-10-29 NOTE — MAU Note (Signed)
Pt states pain is worse with a BM, also when changing positions in bed.

## 2013-10-29 NOTE — MAU Provider Note (Signed)
None     Chief Complaint:  Abdominal Pain   Nancy Romero is  33 y.o. [id number].  Patient's last menstrual period was 10/19/2013. Pt is here with complaints of ~1 year of incisional pain along right lower border of c-section scar. Pt has been having positional pain a/w standing, bending, bowel movements, walking stairs or other large movements. Improved with avoidance but no improvement of symptoms with motrin or APAP. Pt has regular BMs every other day, and urinates without issue.   Pt denies f/c, constipation, n/v, diarrhea, CP, SOB.  OB History   Grav Para Term Preterm Abortions TAB SAB Ect Mult Living   2 2 2       2        History reviewed. No pertinent past medical history.  Past Surgical History  Procedure Laterality Date  . Cesarean section      x2    No family history on file.  History  Substance Use Topics  . Smoking status: Never Smoker   . Smokeless tobacco: Not on file  . Alcohol Use: No    Allergies: No Known Allergies  Prescriptions prior to admission  Medication Sig Dispense Refill  . ibuprofen (ADVIL,MOTRIN) 200 MG tablet Take 200 mg by mouth every 6 (six) hours as needed. For pain relief         Physical Exam   Blood pressure 107/63, pulse 70, temperature 98.7 F (37.1 C), temperature source Oral, resp. rate 18, height 5\' 2"  (1.575 m), weight 104.327 kg (230 lb), last menstrual period 10/19/2013, SpO2 100.00%.  General: General appearance - alert, well appearing, and in no distress Chest - clear to auscultation, no wheezes, rales or rhonchi, symmetric air entry Heart - normal rate, regular rhythm, normal S1, S2, no murmurs, rubs, clicks or gallops Abdomen - Tenderness along c-searan scar R>L. No palpable hernia but extra adipose tissue. Neg rebound, pain with right leg lift Pelvic - not indicated Extremities - peripheral pulses normal, no pedal edema, no clubbing or cyanosis   Labs: Results for orders placed during the hospital  encounter of 10/29/13 (from the past 24 hour(s))  URINALYSIS, ROUTINE W REFLEX MICROSCOPIC   Collection Time    10/29/13  9:06 AM      Result Value Ref Range   Color, Urine YELLOW  YELLOW   APPearance CLEAR  CLEAR   Specific Gravity, Urine 1.025  1.005 - 1.030   pH 6.0  5.0 - 8.0   Glucose, UA NEGATIVE  NEGATIVE mg/dL   Hgb urine dipstick NEGATIVE  NEGATIVE   Bilirubin Urine NEGATIVE  NEGATIVE   Ketones, ur NEGATIVE  NEGATIVE mg/dL   Protein, ur NEGATIVE  NEGATIVE mg/dL   Urobilinogen, UA 0.2  0.0 - 1.0 mg/dL   Nitrite NEGATIVE  NEGATIVE   Leukocytes, UA NEGATIVE  NEGATIVE  POCT PREGNANCY, URINE   Collection Time    10/29/13  9:11 AM      Result Value Ref Range   Preg Test, Ur NEGATIVE  NEGATIVE   Imaging Studies:  No results found.   Assessment: 33 y.o. F with incisional pain. Pt has been waiting for her appointment and was unaware of appointment tomorrow. Pt does not have evidence of obstruction or other emergent bowel pathology. Possible hernia. US shows no evidence of hernia.  DDx includes endometriosis, endometriosis implantation in scar, and hernia. Possible nerve intrapment unlikely at time of onset. Pt does not desire birth control because she would like another child, but may tx pain if  related to endometriosis. - pt to follow up in clinic tomorrow for additional evaluation and work up. - US performed today to expedite work up as outpatient - provide flexeril for PRN pain   Nancy Romero RYAN

## 2013-10-30 ENCOUNTER — Encounter: Payer: Self-pay | Admitting: Obstetrics & Gynecology

## 2013-10-30 ENCOUNTER — Ambulatory Visit (INDEPENDENT_AMBULATORY_CARE_PROVIDER_SITE_OTHER): Payer: Medicaid Other | Admitting: Obstetrics & Gynecology

## 2013-10-30 VITALS — BP 109/72 | HR 77 | Temp 97.4°F | Ht 60.0 in | Wt 228.9 lb

## 2013-10-30 DIAGNOSIS — N949 Unspecified condition associated with female genital organs and menstrual cycle: Secondary | ICD-10-CM

## 2013-10-30 DIAGNOSIS — N920 Excessive and frequent menstruation with regular cycle: Secondary | ICD-10-CM

## 2013-10-30 DIAGNOSIS — G8929 Other chronic pain: Secondary | ICD-10-CM | POA: Insufficient documentation

## 2013-10-30 DIAGNOSIS — Z789 Other specified health status: Secondary | ICD-10-CM | POA: Insufficient documentation

## 2013-10-30 DIAGNOSIS — R102 Pelvic and perineal pain: Secondary | ICD-10-CM

## 2013-10-30 DIAGNOSIS — N921 Excessive and frequent menstruation with irregular cycle: Secondary | ICD-10-CM

## 2013-10-30 LAB — CBC
HCT: 36.6 % (ref 36.0–46.0)
HEMOGLOBIN: 12.3 g/dL (ref 12.0–15.0)
MCH: 29.4 pg (ref 26.0–34.0)
MCHC: 33.6 g/dL (ref 30.0–36.0)
MCV: 87.6 fL (ref 78.0–100.0)
Platelets: 310 10*3/uL (ref 150–400)
RBC: 4.18 MIL/uL (ref 3.87–5.11)
RDW: 15.5 % (ref 11.5–15.5)
WBC: 7.7 10*3/uL (ref 4.0–10.5)

## 2013-10-30 LAB — HEMOGLOBIN A1C
Hgb A1c MFr Bld: 5.6 % (ref <5.7)
Mean Plasma Glucose: 114 mg/dL (ref <117)

## 2013-10-30 NOTE — Progress Notes (Signed)
   CLINIC ENCOUNTER NOTE  History:  33 y.o. [id number] here today for evaluation of menometrorrhagia for several years and possible PCOS.  Also has chronic pelvic pain. Patient was evaluated yesterday in the MAU for incisional pain s/p cesarean section several years ago; no hernia or endometrioma was seen. No other anomalies.  The following portions of the patient's history were reviewed and updated as appropriate: allergies, current medications, past family history, past medical history, past social history, past surgical history and problem list.  Normal pap in 2013 ([facility]  Review of Systems:  Pertinent items are noted in HPI.  Objective:  BP 109/72  Pulse 77  Temp(Src) 97.4 F (36.3 C) (Oral)  Ht 5' (1.524 m)  Wt 228 lb 14.4 oz (103.828 kg)  BMI 44.70 kg/m2  LMP 10/19/2013 Physical Exam deferred  Labs and Imaging 10/29/2013   US PELVIS LIMITED   CLINICAL DATA:  Status post cesarean section x2. Pelvic pain and tenderness at the surgical site. Question hernia. TECHNIQUE: Ultrasound examination of the pelvic soft tissues was performed in the area of clinical concern.  COMPARISON:  None.  FINDINGS: No hernia is identified.  No fluid collection or mass is seen.  IMPRESSION: Negative for hernia.  No finding to explain the patient's symptoms.   Electronically Signed   By: Drusilla Kannerhomas  Dalessio M.D.   On: 10/29/2013 10:30    Assessment & Plan:  Patient has abnormal uterine bleeding . She has a normal exam, no evidence of lesions.  Will order abnormal uterine bleeding evaluation labs.  Ordered pelvic ultrasound to evaluate for any structural gynecologic abnormalities.  Bleeding precautions reviewed.   Patient to return to discuss results and further management, also for annual exam.   Jaynie CollinsUGONNA  Axel Frisk, MD, FACOG Attending Obstetrician & Gynecologist Center for [facility], [facility]

## 2013-10-30 NOTE — Patient Instructions (Signed)
Hemorragia uterina disfuncional (Dysfunctional Uterine Bleeding) Normalmente, los perodos menstruales comienzan en las jvenes de entre 11 y 17 aos. Un ciclo o perodo menstrual puede repetirse entre los 23 das y los 35 das y dura entre 1 y 7 das. Entre los 12 y los 14 das antes de que comience el ciclo menstrual, se produce la ovulacin (los ovarios producen vulos). Cuando cuente los periodos, hgalo desde el primer da del comienzo de la hemorragia del perodo anterior hasta el primer da de la hemorragia del perodo siguiente. La hemorragia uterina disfuncional (anormal) es diferente del perodo menstrual normal. Los perodos pueden comenzar antes o despus que lo habitual. Pueden ser menos abundantes, presentar cogulos, o ser ms abundantes. Puede tener hemorragias entre los perodos o saltear un perodo o ms. Puede tener hemorragia luego de mantener relaciones sexuales, despus de la menopausia o faltarle el perodo menstrual. CAUSAS  Embarazo (normal, aborto espontneo, embarazo ectpico).  DIU (dispositivo intrauterino, anticonceptivo)  Pldoras anticonceptivas  Tratamiento hormonal  La menopausia  Infeccin en el cervix.  Problemas de coagulacin.  Infecciones de la superficie interna del tero.  Endometriosis: la superficie interna del tero se desarrolla en la pelvis y otros rganos femeninos.  Adherencias (tejido cicatrizal) dentro del tero.  Obesidad o severa prdida de peso.  Plipos en el tero.  Cncer en el crvix, la vagina o el tero.  Quiste de ovarios o Sndrome ovrico poliqustico.  Otras enfermedades (diabetes, enfermedad de la tiroides, etc).  Fibromas en el tero (tumores no cancerosos).  Problemas con las hormonas femeninas.  Hiperplasia del endometrio: capa gruesa y clulas agrandadas dentro del tero.  Medicamentos que interfieren con la ovulacin.  Radiacin en la pelvis o el abdomen.  Quimioterapia. DIAGNSTICO  El mdico  analizar la historia de sus perodos menstruales, los medicamentos que toma, los cambios en el peso corporal, el estrs de la vida diaria y cualquier problema mdico que tenga.  El mdico realizar un examen fsico, incluyendo un examen plvico.  Tambin le solicitar anlisis para completar el diagnstico. Ellos son:  Papanicolau.  Anlisis de sangre.  Cultivos para descartar infecciones.  Tomografa computada.  Ultrasonido.  Histeroscopa.  Laparoscopa.  Resonancia magntica.  Histerosalpingografa.  Dilatacin y curetaje.  Biopsia de endometrio. TRATAMIENTO El tratamiento depender de la causa de la hemorragia.. El tratamiento consiste en:  Observacin de los perodos menstruales durante algunos meses.  Prescripcin de medicamentos como:  Antibiticos:  Hormonas.  Pldoras anticonceptivas  Retirar el DIU (dispositivo intrauterino, anticonceptivo).  Ciruga:  Dilatacin y curetaje (raspado y remocin de tejido de la zona interna del tero).  Laparoscopa (examen del interior del abdomen con un tubo con luz).  Ablacin uterina (destruccin de la membrana que cubre el tero con corriente elctrica, rayos lser, congelamiento o calor ).  Histeroscopa (examen del cuello y el tero con un tubo con luz).  Histerectoma (extirpacin del tero). INSTRUCCIONES PARA EL CUIDADO DOMICILIARIO  Si el profesional que la asiste le prescribe medicamentos, tmelos tal como se le indic. No cambie ni reemplace medicamentos sin consultarlo con el profesional.  Las hemorragias de larga duracin pueden traen como consecuencia un dficit de hierro. El profesional que lo asiste podr prescribirle hierro. Esto ayuda a reponer el hierro que el organismo pierde luego de una hemorragia abundante. Tome los medicamentos tal como se le indic.  No tome aspirina o medicamentos que la contengan u desde una semana antes del perodo menstrual ni durante el mismo. La aspirina puede hacer  que la hemorragia empeore.    Si necesita cambiar el apsito o el tampn mas de una vez cada 2 horas, permanezca en cama con los pies elevados y aplique una compresa fra en la zona baja del abdomen. Descanse todo lo que pueda hasta que la hemorragia se detenga.  Consuma alimentos balanceados. Coma alimentos ricos en hierro. Por ejemplo:  Vegetales verdes frescos.  Cereales integrales y cereales y panes con salvado.  Huevos.  Carnes.  Hgado.  No trate de perder peso hasta que la hemorragia anormal se detenga y los niveles de hierro en la sangre vuelvan a la normalidad. No levante pesos de ms de 10 libras (4.5 kg.) ni realice actividades extenuantes mientras tenga la hemorragia.  Durante un par de meses tome nota en un almanaque y marque el comienzo y el fin de su perodo menstrual y el tipo de sangrado (escaso, medio, abundante, gotas, cogulos o falta de perodo). El objetivo es que su mdico evale mejor el problema. SOLICITE ATENCIN MDICA SI:  Debe cambiar el apsito o el tampn ms de una vez cada hora.  Se siente mareada o dbil.  Tiene algn problema que pueda relacionarse con el medicamento que est tomando.  Siente dolor.  Desea retirar su DIU.  Quiere suspender o cambiar sus pldoras anticonceptivas u hormonas.  Tiene algn tipo de hemorragia anormal mencionada anteriormente.  Tiene ms de 16 aos y an no ha tenido su perodo menstrual.  Tiene 55 aos y an tiene perodos menstruales.  Tiene alguno de los sntomas mencionados anteriormente.  Aparece una erupcin cutnea. SOLICITE ATENCIN MDICA DE INMEDIATO SI:  La temperatura oral se eleva sin motivo por encima de 38,9 C (102 F).  Comienza a sentir escalofros.  Debe cambiar el apsito o el tampn ms de una vez cada hora.  Siente dolor abdominal.  Pierde el conocimiento, se desmaya. Document Released: 01/12/2005 Document Revised: 12/28/2011 ExitCare Patient Information 2015 [employer]. This  information is not intended to replace advice given to you by your health care provider. Make sure you discuss any questions you have with your health care provider.  

## 2013-10-31 LAB — TESTOSTERONE, FREE, TOTAL, SHBG
Sex Hormone Binding: 63 nmol/L (ref 18–114)
TESTOSTERONE FREE: 6.1 pg/mL (ref 0.6–6.8)
Testosterone-% Free: 1.2 % (ref 0.4–2.4)
Testosterone: 52 ng/dL (ref 10–70)

## 2013-10-31 LAB — TSH: TSH: 1.808 u[IU]/mL (ref 0.350–4.500)

## 2013-10-31 LAB — FOLLICLE STIMULATING HORMONE: FSH: 3.8 m[IU]/mL

## 2013-10-31 LAB — PROLACTIN: PROLACTIN: 10.2 ng/mL

## 2013-11-20 ENCOUNTER — Ambulatory Visit: Payer: Medicaid Other | Admitting: Obstetrics & Gynecology

## 2013-12-04 ENCOUNTER — Ambulatory Visit (HOSPITAL_COMMUNITY): Admission: RE | Admit: 2013-12-04 | Payer: Medicaid Other | Source: Ambulatory Visit

## 2014-02-17 ENCOUNTER — Encounter: Payer: Self-pay | Admitting: Obstetrics & Gynecology

## 2014-11-18 ENCOUNTER — Encounter (HOSPITAL_COMMUNITY): Payer: Self-pay | Admitting: *Deleted

## 2014-11-18 ENCOUNTER — Inpatient Hospital Stay (HOSPITAL_COMMUNITY)
Admission: AD | Admit: 2014-11-18 | Discharge: 2014-11-18 | Disposition: A | Discharge status: Home / Self Care | Payer: Self-pay | Source: Ambulatory Visit | Attending: Family Medicine | Admitting: Family Medicine

## 2014-11-18 DIAGNOSIS — D509 Iron deficiency anemia, unspecified: Secondary | ICD-10-CM | POA: Insufficient documentation

## 2014-11-18 DIAGNOSIS — D500 Iron deficiency anemia secondary to blood loss (chronic): Secondary | ICD-10-CM

## 2014-11-18 DIAGNOSIS — G8929 Other chronic pain: Secondary | ICD-10-CM | POA: Insufficient documentation

## 2014-11-18 DIAGNOSIS — N939 Abnormal uterine and vaginal bleeding, unspecified: Secondary | ICD-10-CM

## 2014-11-18 DIAGNOSIS — R102 Pelvic and perineal pain: Secondary | ICD-10-CM | POA: Insufficient documentation

## 2014-11-18 LAB — URINE MICROSCOPIC-ADD ON

## 2014-11-18 LAB — CBC
HCT: 21.9 % — ABNORMAL LOW (ref 36.0–46.0)
Hemoglobin: 6.8 g/dL — CL (ref 12.0–15.0)
MCH: 25 pg — AB (ref 26.0–34.0)
MCHC: 31.1 g/dL (ref 30.0–36.0)
MCV: 80.5 fL (ref 78.0–100.0)
PLATELETS: 312 10*3/uL (ref 150–400)
RBC: 2.72 MIL/uL — AB (ref 3.87–5.11)
RDW: 17.7 % — ABNORMAL HIGH (ref 11.5–15.5)
WBC: 9.4 10*3/uL (ref 4.0–10.5)

## 2014-11-18 LAB — WET PREP, GENITAL
Trich, Wet Prep: NONE SEEN
YEAST WET PREP: NONE SEEN

## 2014-11-18 LAB — URINALYSIS, ROUTINE W REFLEX MICROSCOPIC
Bilirubin Urine: NEGATIVE
GLUCOSE, UA: NEGATIVE mg/dL
KETONES UR: NEGATIVE mg/dL
Leukocytes, UA: NEGATIVE
NITRITE: NEGATIVE
PROTEIN: NEGATIVE mg/dL
UROBILINOGEN UA: 0.2 mg/dL (ref 0.0–1.0)
pH: 5.5 (ref 5.0–8.0)

## 2014-11-18 LAB — POCT PREGNANCY, URINE: Preg Test, Ur: NEGATIVE

## 2014-11-18 MED ORDER — MEGESTROL ACETATE 40 MG PO TABS
40.0000 mg | ORAL_TABLET | Freq: Once | ORAL | Status: AC
  Administered 2014-11-18: 40 mg via ORAL
  Filled 2014-11-18: qty 1

## 2014-11-18 MED ORDER — MEGESTROL ACETATE 40 MG PO TABS: 40.0000 mg | ORAL_TABLET | Freq: Two times a day (BID) | ORAL | Status: DC

## 2014-11-18 MED ORDER — IBUPROFEN 600 MG PO TABS
600.0000 mg | ORAL_TABLET | Freq: Once | ORAL | Status: AC
  Administered 2014-11-18: 600 mg via ORAL
  Filled 2014-11-18: qty 1

## 2014-11-18 MED ORDER — KETOROLAC TROMETHAMINE 60 MG/2ML IM SOLN
60.0000 mg | Freq: Once | INTRAMUSCULAR | Status: DC
  Filled 2014-11-18: qty 2

## 2014-11-18 MED ORDER — SODIUM CHLORIDE 0.9 % IV SOLN
510.0000 mg | Freq: Once | INTRAVENOUS | Status: AC
  Administered 2014-11-18: 510 mg via INTRAVENOUS
  Filled 2014-11-18: qty 17

## 2014-11-18 MED ORDER — FERROUS SULFATE 325 (65 FE) MG PO TABS: 325.0000 mg | ORAL_TABLET | Freq: Two times a day (BID) | ORAL | Status: DC

## 2014-11-18 NOTE — MAU Provider Note (Signed)
History     CSN: [id number]  Arrival date and time: 11/18/14 1415   First Provider Initiated Contact with Patient 11/18/14 1541      Chief Complaint  Patient presents with  . Vaginal Bleeding   HPI    [name]  is a 34 y/o F with PMH of chronic pelvic pain, abnormal uterine bleeding and ?PCOS who presents today for evaluate of vaginal bleeding that has been ongoing for approximately 3 weeks.  Patient states that her period started "20 days ago," and has not stopped.  Notes that she is also passing clots.  Abdominal pain is associated with intermittent lower abdominal pain that ranges from a 2/10 to a 5/10, and occasionally radiates into her left back.  Denies any pain with urination, hematuria, foul smelling urine or vaginal discharge. No N/V/D.  Patient had to use 5 pads today, and states that she was mainly concerned about the length of bleeding, as she has never had a period that lasted this long.   Prior to this, her last period was from April 9th to 14th and was "normal" for her.   When asked specifically, states that she has a period every month. Of note, patient desires pregnancy and is sexually active with her partner.  She does not use any form of contraception.  She tried ibuprofen; she took one pill, minimal relief.   OB History    Gravida Para Term Preterm AB TAB SAB Ectopic Multiple Living   2 2 2       2       History reviewed. No pertinent past medical history.  Past Surgical History  Procedure Laterality Date  . Cesarean section      x2    Family History  Problem Relation Age of Onset  . Alcohol abuse Neg Hx   . Arthritis Neg Hx   . Asthma Neg Hx   . Birth defects Neg Hx   . Cancer Neg Hx   . COPD Neg Hx   . Depression Neg Hx   . Diabetes Neg Hx   . Drug abuse Neg Hx   . Early death Neg Hx   . Hearing loss Neg Hx   . Heart disease Neg Hx   . Hyperlipidemia Neg Hx   . Hypertension Neg Hx   . Kidney disease Neg Hx   . Learning  disabilities Neg Hx   . Mental illness Neg Hx   . Mental retardation Neg Hx   . Miscarriages / Stillbirths Neg Hx   . Stroke Neg Hx   . Vision loss Neg Hx   . Varicose Veins Neg Hx     History  Substance Use Topics  . Smoking status: Never Smoker   . Smokeless tobacco: Not on file  . Alcohol Use: No    Allergies: No Known Allergies  Prescriptions prior to admission  Medication Sig Dispense Refill Last Dose  . ferrous sulfate 325 (65 FE) MG tablet Take 325 mg by mouth daily with breakfast.   11/18/2014 at Unknown time  . ibuprofen (ADVIL,MOTRIN) 200 MG tablet Take 200 mg by mouth every 6 (six) hours as needed. For pain relief   11/18/2014 at Unknown time   Results for orders placed or performed during the hospital encounter of 11/18/14 (from the past 48 hour(s))  Urinalysis, Routine w reflex microscopic (not at [facility])     Status: Abnormal   Collection Time: 11/18/14  2:30 PM  Result Value Ref Range  Color, Urine YELLOW YELLOW   APPearance HAZY (A) CLEAR   Specific Gravity, Urine >1.030 (H) 1.005 - 1.030   pH 5.5 5.0 - 8.0   Glucose, UA NEGATIVE NEGATIVE mg/dL   Hgb urine dipstick LARGE (A) NEGATIVE   Bilirubin Urine NEGATIVE NEGATIVE   Ketones, ur NEGATIVE NEGATIVE mg/dL   Protein, ur NEGATIVE NEGATIVE mg/dL   Urobilinogen, UA 0.2 0.0 - 1.0 mg/dL   Nitrite NEGATIVE NEGATIVE   Leukocytes, UA NEGATIVE NEGATIVE  Pregnancy, urine POC     Status: None   Collection Time: 11/18/14  2:30 PM  Result Value Ref Range   Preg Test, Ur NEGATIVE NEGATIVE    Comment:        THE SENSITIVITY OF THIS METHODOLOGY IS >24 mIU/mL   Urine microscopic-add on     Status: Abnormal   Collection Time: 11/18/14  2:30 PM  Result Value Ref Range   Squamous Epithelial / LPF FEW (A) RARE   WBC, UA 0-2 <3 WBC/hpf   RBC / HPF 21-50 <3 RBC/hpf   Bacteria, UA MANY (A) RARE  Wet prep, genital     Status: Abnormal   Collection Time: 11/18/14  4:45 PM  Result Value Ref Range   Yeast Wet Prep HPF POC NONE  SEEN NONE SEEN   Trich, Wet Prep NONE SEEN NONE SEEN   Clue Cells Wet Prep HPF POC FEW (A) NONE SEEN   WBC, Wet Prep HPF POC FEW (A) NONE SEEN    Comment: MANY BACTERIA SEEN  CBC     Status: Abnormal   Collection Time: 11/18/14  5:00 PM  Result Value Ref Range   WBC 9.4 4.0 - 10.5 K/uL   RBC 2.72 (L) 3.87 - 5.11 MIL/uL   Hemoglobin 6.8 (LL) 12.0 - 15.0 g/dL    Comment: REPEATED TO VERIFY CRITICAL RESULT CALLED TO, READ BACK BY AND VERIFIED WITH: CARTER,D. AT 1717 ON 8.2.16 BY JPEEBLES    HCT 21.9 (L) 36.0 - 46.0 %   MCV 80.5 78.0 - 100.0 fL   MCH 25.0 (L) 26.0 - 34.0 pg   MCHC 31.1 30.0 - 36.0 g/dL   RDW 16.1 (H) 09.6 - 04.5 %   Platelets 312 150 - 400 K/uL  HIV antibody     Status: None   Collection Time: 11/18/14  5:00 PM  Result Value Ref Range   HIV Screen 4th Generation wRfx Non Reactive Non Reactive    Comment: (NOTE) Performed At: [facility] [address] [place], [place] [id number] Mila Homer MD NW:[id number]     Review of Systems  Constitutional: Positive for fever and malaise/fatigue.  Gastrointestinal: Positive for abdominal pain.  Genitourinary: Negative for dysuria, urgency and frequency.  Neurological: Negative for dizziness.   Physical Exam   Blood pressure 124/71, pulse 77, temperature 98.2 F (36.8 C), resp. rate 18, weight 104.781 kg (231 lb), last menstrual period 10/30/2014, SpO2 100 %.  Physical Exam  Constitutional: She is oriented to person, place, and time. She appears well-developed and well-nourished. No distress.  Genitourinary:  Speculum exam: Vagina - Small amount of dark red blood noted in the vagina. No pooling  Cervix - + contact bleeding  Bimanual exam: Cervix closed Uterus non tender, normal size Adnexa with tenderness bilaterally; generalized discomfort with exam.  GC/Chlam, wet prep done Chaperone present for exam.  Neurological: She is alert and oriented to person, place, and time.  Skin: Skin is warm and  dry. She is not diaphoretic. There is pallor.  Psychiatric: Her behavior is normal.    MAU Course  Procedures  none  MDM Toradol 60 mg IM; patient declined Ibuprofen 600 mg PO   Feraheme IVPB 510 Megace 40 mg in MAU   Assessment and Plan   A:  1. Iron deficiency anemia due to chronic blood loss   2. Abnormal vaginal bleeding    P:  Discharge home in stable condition RX: Megace 40 mg BID         PO iron BID Bleeding precautions Out patient Pelvic US; scheduled for 8/3 Follow up with WOC; message sent to the woc Return to MAU if symptoms worsen    Duane Lope, NP 11/18/2014 4:50 PM

## 2014-11-18 NOTE — MAU Note (Signed)
Critical Hemoglobin called to RN from Lab; Hemoglobin is 6.8; Provider notified;

## 2014-11-18 NOTE — Discharge Instructions (Signed)
Anemia inespecfica (Anemia, Nonspecific) La anemia es una enfermedad en la que la concentracin de glbulos rojos o el nivel de hemoglobina en la sangre estn por debajo de lo normal. La hemoglobina es la sustancia de los glbulos rojos que lleva el oxgeno a todo el cuerpo. La anemia da como resultado que los tejidos no reciban la cantidad suficiente de oxgeno.  CAUSAS  Las causas ms frecuentes de anemia son:   Nancy Romero. El sangrado puede ser interno o externo. Incluye sangrado excesivo debido al perodo (en las mujeres) o por los intestinos.   Dficit nutricional.   Enfermedad renal, tiroidea o heptica crnicas.  Enfermedades de la mdula sea que disminuyen la produccin de glbulos rojos.  Cncer y tratamientos para Management consultant.  VIH, sida y sus tratamientos.  Trastornos del bazo que aumentan la destruccin de glbulos rojos.  Enfermedades de [employer].  Destruccin excesiva de glbulos rojos debido a una infeccin, a medicamentos y a Chartered loss adjuster. SIGNOS Y SNTOMAS   Debilidad leve.   Mareos.   Dolor de [place].  Palpitaciones.   Falta de aire, especialmente con el ejercicio.   Palidez.  Sensibilidad al fro.  Indigestin.  Nuseas.  Dificultad para dormir.  Dificultad para concentrarse. Los sntomas pueden ocurrir repentinamente o pueden Medical illustrator.  DIAGNSTICO  Con frecuencia es necesario realizar anlisis de [employer]. Estos ayudan al profesional a Futures trader. Su mdico controlar la materia fecal para Engineer, manufacturing la presencia de [place] y buscar otras causas de prdida de [place].  TRATAMIENTO  El tratamiento vara segn la causa de la anemia. Las opciones de tratamiento son:   Suplementos de hierro, vitamina B12, o cido flico.   Medicamentos con hormonas.   Transfusin de [place]. Ser necesaria en los casos de prdida de [place] grave.   Hospitalizacin. Ser necesaria si la  prdida de sangre es continua y significativa.   Cambios en la dieta.  Extirpacin del bazo. INSTRUCCIONES PARA EL CUIDADO EN EL HOGAR Cumpla con todas las visitas de control. Generalmente demora varias semanas corregir la anemia, y es muy importante que el mdico controle su enfermedad y su respuesta al [place]. SOLICITE ATENCIN MDICA DE INMEDIATO SI:   Siente debilidad extrema, falta de aire o dolor en el pecho.   Se siente mareado o tiene dificultad para concentrarse.  Tiene una hemorragia vaginal abundante.   Aparece una erupcin cutnea.   La materia fecal es negra, de aspecto alquitranado.   Se desmaya.   Vomita sangre.   Vomita repetidas veces.   Siente dolor abdominal.  Tiene fiebre o sntomas persistentes durante ms de 2 - 3 das.   Tiene fiebre y los sntomas empeoran repentinamente.   Se deshidrata.  ASEGRESE DE QUE:  Comprende estas instrucciones.  Controlar su afeccin.  Recibir ayuda de inmediato si no mejora o si empeora. Document Released: 04/04/2005 Document Revised: 12/05/2012 [facility] Patient Information 2015 [place], [place]. This information is not intended to replace advice given to you by your health care provider. Make sure you discuss any questions you have with your health care provider.  Sangrado uterino anormal (Abnormal Uterine Bleeding) Sangrado uterino anormal significa que hay un sangrado por la vagina que no es su perodo menstrual normal. Puede ser:  Prdidas de sangre o hemorragias entre los perodos.  Hemorragias luego de Warehouse manager sexo [employer]).  Sangrado abundante o ms que lo habitual.  Perodos que duran ms que lo normal.  Sangrado luego de la menopausia. Hay muchos problemas que pueden  ser la causa. El tratamiento depender de la causa del sangrado. Cualquier tipo de sangrado que no sea normal debe consultarse con el mdico.  CUIDADOS EN EL HOGAR Controle su afeccin para ver si hay  cambios. Estas indicaciones podrn disminuir cualquier molestia que tenga:  No use tampones ni duchas vaginales o como le haya indicado el mdico.  Cambie los apsitos con frecuencia. Deber hacerse exmenes plvicos regulares y pruebas de [place]. Realice los estudios indicados segn le indique su mdico. SOLICITE AYUDA SI:  El sangrado dura ms de 1 semana.  Se siente mareada por momentos. SOLICITE AYUDA DE INMEDIATO SI:   Se desmaya.  Tiene que [employer] apsitos cada 15 a 30 minutos.  Siente dolor en el abdomen.  Tiene fiebre.  Se siente dbil o presenta sudoracin.  Elimina cogulos grandes por la vagina.  Siente Programme researcher, broadcasting/film/video (nuseas) y devuelve (vomita). ASEGRESE DE QUE:  Comprende estas instrucciones.  Controlar su afeccin.  Recibir ayuda de inmediato si no mejora o si empeora. Document Released: 05/07/2010 Document Revised: 04/09/2013 [facility] Patient Information [address], [place]. This information is not intended to replace advice given to you by your health care provider. Make sure you discuss any questions you have with your health care provider.

## 2014-11-18 NOTE — MAU Note (Signed)
C/o vaginal bleeding for past 18-20 days; c/o weakness and tiredness;

## 2014-11-18 NOTE — MAU Note (Signed)
Has been bleeding for 18-20 days.  No period the 3 months before. Started having cramping in lower abd and back about a wk ago.  Feels very tired and dizzy

## 2014-11-18 NOTE — MAU Note (Deleted)
Pt c/o lower abdominal pain and vaginal pain that has been going on for about a month now and it got worse today. Had some vaginal bleeding that has resolved. Has an IUD that was placed 1 year ago [facility] the [facility] dept. Called the health dept and was told to come here.  

## 2014-11-19 ENCOUNTER — Other Ambulatory Visit (HOSPITAL_COMMUNITY): Payer: Self-pay | Admitting: Obstetrics and Gynecology

## 2014-11-19 ENCOUNTER — Ambulatory Visit (HOSPITAL_COMMUNITY)
Admission: RE | Admit: 2014-11-19 | Discharge: 2014-11-19 | Disposition: A | Discharge status: Home / Self Care | Payer: Self-pay | Source: Ambulatory Visit | Attending: Obstetrics and Gynecology | Admitting: Obstetrics and Gynecology

## 2014-11-19 DIAGNOSIS — N939 Abnormal uterine and vaginal bleeding, unspecified: Secondary | ICD-10-CM | POA: Insufficient documentation

## 2014-11-19 DIAGNOSIS — R102 Pelvic and perineal pain: Secondary | ICD-10-CM | POA: Insufficient documentation

## 2014-11-19 DIAGNOSIS — D500 Iron deficiency anemia secondary to blood loss (chronic): Secondary | ICD-10-CM

## 2014-11-19 LAB — HIV ANTIBODY (ROUTINE TESTING W REFLEX): HIV SCREEN 4TH GENERATION: NONREACTIVE

## 2014-11-19 LAB — GC/CHLAMYDIA PROBE AMP (~~LOC~~) NOT AT ARMC
Chlamydia: NEGATIVE
NEISSERIA GONORRHEA: NEGATIVE

## 2014-12-24 ENCOUNTER — Ambulatory Visit (INDEPENDENT_AMBULATORY_CARE_PROVIDER_SITE_OTHER): Payer: Self-pay | Admitting: Obstetrics & Gynecology

## 2014-12-24 ENCOUNTER — Encounter: Payer: Self-pay | Admitting: Obstetrics & Gynecology

## 2014-12-24 VITALS — BP 117/79 | HR 91 | Temp 99.1°F | Wt 234.1 lb

## 2014-12-24 DIAGNOSIS — N939 Abnormal uterine and vaginal bleeding, unspecified: Secondary | ICD-10-CM | POA: Insufficient documentation

## 2014-12-24 DIAGNOSIS — D500 Iron deficiency anemia secondary to blood loss (chronic): Secondary | ICD-10-CM

## 2014-12-24 MED ORDER — FERROUS SULFATE 325 (65 FE) MG PO TABS: 325.0000 mg | ORAL_TABLET | Freq: Two times a day (BID) | ORAL | Status: DC

## 2014-12-24 MED ORDER — MEGESTROL ACETATE 20 MG PO TABS: 40.0000 mg | ORAL_TABLET | Freq: Two times a day (BID) | ORAL | Status: DC

## 2014-12-24 NOTE — Patient Instructions (Signed)
Hemorragia uterina disfuncional (Dysfunctional Uterine Bleeding) Normalmente, los perodos menstruales comienzan en las jvenes de entre 11 y 17 aos. Un ciclo o perodo menstrual puede repetirse entre los 23 das y los 35 das y dura entre 1 y 7 das. Entre los 12 y los 14 das antes de que comience el ciclo menstrual, se produce la ovulacin (los ovarios producen vulos). Cuando cuente los periodos, hgalo desde el primer da del comienzo de la hemorragia del perodo anterior hasta el primer da de la hemorragia del perodo siguiente. La hemorragia uterina disfuncional (anormal) es diferente del perodo menstrual normal. Los perodos pueden comenzar antes o despus que lo habitual. Pueden ser menos abundantes, presentar cogulos, o ser ms abundantes. Puede tener hemorragias entre los perodos o saltear un perodo o ms. Puede tener hemorragia luego de mantener relaciones sexuales, despus de la menopausia o faltarle el perodo menstrual. CAUSAS  Embarazo (normal, aborto espontneo, embarazo ectpico).  DIU (dispositivo intrauterino, anticonceptivo)  Pldoras anticonceptivas  Tratamiento hormonal  La menopausia  Infeccin en el cervix.  Problemas de coagulacin.  Infecciones de la superficie interna del tero.  Endometriosis: la superficie interna del tero se desarrolla en la pelvis y otros rganos femeninos.  Adherencias (tejido cicatrizal) dentro del tero.  Obesidad o severa prdida de peso.  Plipos en el tero.  Cncer en el crvix, la vagina o el tero.  Quiste de ovarios o Sndrome ovrico poliqustico.  Otras enfermedades (diabetes, enfermedad de la tiroides, etc).  Fibromas en el tero (tumores no cancerosos).  Problemas con las hormonas femeninas.  Hiperplasia del endometrio: capa gruesa y clulas agrandadas dentro del tero.  Medicamentos que interfieren con la ovulacin.  Radiacin en la pelvis o el abdomen.  Quimioterapia. DIAGNSTICO  El mdico  analizar la historia de sus perodos menstruales, los medicamentos que toma, los cambios en el peso corporal, el estrs de la vida diaria y cualquier problema mdico que tenga.  El mdico realizar un examen fsico, incluyendo un examen plvico.  Tambin le solicitar anlisis para completar el diagnstico. Ellos son:  Papanicolau.  Anlisis de sangre.  Cultivos para descartar infecciones.  Tomografa computada.  Ultrasonido.  Histeroscopa.  Laparoscopa.  Resonancia magntica.  Histerosalpingografa.  Dilatacin y curetaje.  Biopsia de endometrio. TRATAMIENTO El tratamiento depender de la causa de la hemorragia.. El tratamiento consiste en:  Observacin de los perodos menstruales durante algunos meses.  Prescripcin de medicamentos como:  Antibiticos:  Hormonas.  Pldoras anticonceptivas  Retirar el DIU (dispositivo intrauterino, anticonceptivo).  Ciruga:  Dilatacin y curetaje (raspado y remocin de tejido de la zona interna del tero).  Laparoscopa (examen del interior del abdomen con un tubo con luz).  Ablacin uterina (destruccin de la membrana que cubre el tero con corriente elctrica, rayos lser, congelamiento o calor ).  Histeroscopa (examen del cuello y el tero con un tubo con luz).  Histerectoma (extirpacin del tero). INSTRUCCIONES PARA EL CUIDADO DOMICILIARIO  Si el profesional que la asiste le prescribe medicamentos, tmelos tal como se le indic. No cambie ni reemplace medicamentos sin consultarlo con el profesional.  Las hemorragias de larga duracin pueden traen como consecuencia un dficit de hierro. El profesional que lo asiste podr prescribirle hierro. Esto ayuda a reponer el hierro que el organismo pierde luego de una hemorragia abundante. Tome los medicamentos tal como se le indic.  No tome aspirina o medicamentos que la contengan u desde una semana antes del perodo menstrual ni durante el mismo. La aspirina puede hacer  que la hemorragia empeore.    Si necesita cambiar el apsito o el tampn mas de una vez cada 2 horas, permanezca en cama con los pies elevados y aplique una compresa fra en la zona baja del abdomen. Descanse todo lo que pueda hasta que la hemorragia se detenga.  Consuma alimentos balanceados. Coma alimentos ricos en hierro. Por ejemplo:  Vegetales verdes frescos.  Cereales integrales y cereales y panes con salvado.  Huevos.  Carnes.  Hgado.  No trate de perder peso hasta que la hemorragia anormal se detenga y los niveles de hierro en la sangre vuelvan a la normalidad. No levante pesos de ms de 10 libras (4.5 kg.) ni realice actividades extenuantes mientras tenga la hemorragia.  Durante un par de meses tome nota en un almanaque y marque el comienzo y el fin de su perodo menstrual y el tipo de sangrado (escaso, medio, abundante, gotas, cogulos o falta de perodo). El objetivo es que su mdico evale mejor el problema. SOLICITE ATENCIN MDICA SI:  Debe cambiar el apsito o el tampn ms de una vez cada hora.  Se siente mareada o dbil.  Tiene algn problema que pueda relacionarse con el medicamento que est tomando.  Siente dolor.  Desea retirar su DIU.  Quiere suspender o cambiar sus pldoras anticonceptivas u hormonas.  Tiene algn tipo de hemorragia anormal mencionada anteriormente.  Tiene ms de 16 aos y an no ha tenido su perodo menstrual.  Tiene 55 aos y an tiene perodos menstruales.  Tiene alguno de los sntomas mencionados anteriormente.  Aparece una erupcin cutnea. SOLICITE ATENCIN MDICA DE INMEDIATO SI:  La temperatura oral se eleva sin motivo por encima de 38,9 C (102 F).  Comienza a sentir escalofros.  Debe cambiar el apsito o el tampn ms de una vez cada hora.  Siente dolor abdominal.  Pierde el conocimiento, se desmaya. Document Released: 01/12/2005 Document Revised: 12/28/2011 ExitCare Patient Information 2015 [employer]. This  information is not intended to replace advice given to you by your health care provider. Make sure you discuss any questions you have with your health care provider.  

## 2014-12-24 NOTE — Progress Notes (Signed)
Patient ID: Nancy Romero, female   DOB: 08-Jul-1980, 34 y.o.   MRN: [id number] Medicine went [facility], pt needs medication to go to [facility], pharmacy correct in system.

## 2014-12-24 NOTE — Progress Notes (Signed)
   CLINIC ENCOUNTER NOTE  History:  34 y.o. [id number] here today for AUB followup and to review results of ultrasound. Patient is Spanish-speaking only, Spanish interpreter present for this encounter.  Has been treated with Megace, this is working for her.  She denies any abnormal vaginal discharge, bleeding, pelvic pain or other concerns.   History reviewed. No pertinent past medical history.  Past Surgical History  Procedure Laterality Date  . Cesarean section      The following portions of the patient's history were reviewed and updated as appropriate: allergies, current medications, past family history, past medical history, past social history, past surgical history and problem list.   Health Maintenance:  Normal pap in 2015.  Review of Systems:  Pertinent items are noted in HPI. Comprehensive review of systems was otherwise negative.  Objective:  Physical Exam BP 117/79 mmHg  Pulse 91  Temp(Src) 99.1 F (37.3 C)  Wt 234 lb 1.6 oz (106.187 kg) CONSTITUTIONAL: Well-developed, well-nourished female in no acute distress.  HENT:  Normocephalic, atraumatic. External right and left ear normal. Oropharynx is clear and moist EYES: Conjunctivae and EOM are normal. Pupils are equal, round, and reactive to light. No scleral icterus.  NECK: Normal range of motion, supple, no masses SKIN: Skin is warm and dry. No rash noted. Not diaphoretic. No erythema. No pallor. NEUROLGIC: Alert and oriented to person, place, and time. Normal reflexes, muscle tone coordination. No cranial nerve deficit noted. PSYCHIATRIC: Normal mood and affect. Normal behavior. Normal judgment and thought content. CARDIOVASCULAR: Normal heart rate noted RESPIRATORY: Effort and breath sounds normal, no problems with respiration noted ABDOMEN: Soft, no distention noted.   PELVIC: Deferred MUSCULOSKELETAL: Normal range of motion. No edema noted.  Labs and Imaging 11/19/2014   CLINICAL DATA:  Vaginal pain and bleeding.   EXAM: TRANSABDOMINAL AND TRANSVAGINAL ULTRASOUND OF PELVIS  TECHNIQUE: Both transabdominal and transvaginal ultrasound examinations of the pelvis were performed. Transabdominal technique was performed for global imaging of the pelvis including uterus, ovaries, adnexal regions, and pelvic cul-de-sac. It was necessary to proceed with endovaginal exam following the transabdominal exam to visualize the uterus, endometrium and ovaries.  COMPARISON:  None  FINDINGS: Uterus  Measurements: 9.7 x 4.9 x 6.1 cm. No fibroids or other mass visualized.  Endometrium  Thickness: 9.3 mm. No IUD identified. No focal abnormality visualized.  Right ovary  Measurements: 3.9 x 2.6 x 2.0 cm. Normal appearance/no adnexal mass.  Left ovary  Measurements: 3.8 x 2.1 x 2.4 cm. Normal appearance/no adnexal mass.  Other findings  No free fluid.  IMPRESSION: 1. Normal pelvic sonogram. No findings to explain patient's vaginal pain.   Electronically Signed   By: Signa Kell M.D.   On: 11/19/2014 13:09    Assessment & Plan:  Ultrasound results reviewed.  Likely anovulatory bleeding, especially given her morbid obesity.  Explained role of obesity in increasing exogenous estrogen, leading to unopposed estrogen effect on uterus that can lead to AUB, hyperplasia or neoplasia.  Will continue Megace for now, weight loss recommended. Routine preventative health maintenance measures emphasized. Please refer to After Visit Summary for other counseling recommendations.    Total face-to-face time with patient: 15 minutes. Over 50% of encounter was spent on counseling and coordination of care.   Jaynie Collins, MD, FACOG Attending Obstetrician & Gynecologist, [facility] for [facility]

## 2015-07-17 ENCOUNTER — Encounter: Payer: Self-pay | Admitting: Internal Medicine

## 2015-07-17 ENCOUNTER — Ambulatory Visit (INDEPENDENT_AMBULATORY_CARE_PROVIDER_SITE_OTHER): Payer: Self-pay | Admitting: Internal Medicine

## 2015-07-17 VITALS — BP 110/80 | HR 80 | Ht 60.0 in | Wt 244.0 lb

## 2015-07-17 DIAGNOSIS — R3589 Other polyuria: Secondary | ICD-10-CM

## 2015-07-17 DIAGNOSIS — R358 Other polyuria: Secondary | ICD-10-CM

## 2015-07-17 DIAGNOSIS — N939 Abnormal uterine and vaginal bleeding, unspecified: Secondary | ICD-10-CM

## 2015-07-17 DIAGNOSIS — N949 Unspecified condition associated with female genital organs and menstrual cycle: Secondary | ICD-10-CM

## 2015-07-17 DIAGNOSIS — R102 Pelvic and perineal pain: Secondary | ICD-10-CM

## 2015-07-17 DIAGNOSIS — G8929 Other chronic pain: Secondary | ICD-10-CM

## 2015-07-17 DIAGNOSIS — F341 Dysthymic disorder: Secondary | ICD-10-CM

## 2015-07-17 LAB — GLUCOSE, POCT (MANUAL RESULT ENTRY): POC Glucose: 83 mg/dl (ref 70–99)

## 2015-07-17 NOTE — Progress Notes (Signed)
Subjective:    Patient ID: Nancy Romero, female    DOB: [birth year], 35 y.o.   MRN: [id number]  HPI   1.  Heavy periods:  7 months ago, started again.  Presented to our clinic when a volunteer clinic about this time 2 years ago.  Had had similar problems then for about 3 years following a Csection delivery.   Pt. Did have an evaluation for this at [facility] in August of 2016. Pelvic ultrasound was normal and she was noted to be quite anemic with a hemoglobin of 6.8.  Was started on Megace 40 mg twice daily.   The Megace completely stopped her periods.  She was seen again at [facility] clinic, Dr. Jaynie Collins, due to low pelvic pain, which has also been a chronic issue since her Csection 5 years ago and was told to stay on the Megace per patient.   11 days ago, started vaginal bleeding, heavy and painful the first 4 days, then much less so thereafter.  Using a pad every 4 hours, but not soaked.   Feels sleepy. Her family notes her breathing at rest to be labored.  She reportedly snores loudly with sleep, but is not aware of apneic episodes.  Is very hungry all the time.   Morning meal:  Frosted Flakes--large bowl.  Uses 2% milk.   2 hours later:  2 fried eggs and 3 corn tortillas.  Has fruit water-sweet. 3 p.m.:  Chicken, rice, 6 tortillas--large plate, drinks fruit water or plain water 7 p.m.:  Chicken, rice, 6 tortillas, Juice No food after the evening meal.    She feels her appetite is bigger since starting on Megace.  Is walking more recently.    Has noted she is thirsty frequently and urinating a lot for past 2-3 months.  Urinates a large volume of urine 6 times nightly. [facility] at [facility] was okay when she discussed this with them before.   Current outpatient prescriptions:  .  ferrous sulfate 325 (65 FE) MG tablet, Take 1 tablet (325 mg total) by mouth 2 (two) times daily with a meal., Disp: 60 tablet, Rfl: 6 .  ibuprofen (ADVIL,MOTRIN) 200 MG  tablet, Take 200 mg by mouth every 6 (six) hours as needed. For pain relief, Disp: , Rfl:  .  megestrol (MEGACE) 20 MG tablet, Take 2 tablets (40 mg total) by mouth 2 (two) times daily., Disp: 180 tablet, Rfl: 6   No Known Allergies   Past Medical History  Diagnosis Date  . Anemia    Past Surgical History  Procedure Laterality Date  . Cesarean section       x 2     Review of Systems     Objective:   Physical Exam Breathing heavily- from throat Morbidly obese.   HEENT:  PERRL, a bit exotropia bilaterally, TMs pearly gray, throat without injection. Neck:  Very short and with large girth, supple, no adenopathy or thyromegaly Chest:  CTA CV: RRR with normal S1 and S2, No S3, S4, or murmur.  Carotid and radial pulses normal and equal Abd:  S, NT, No HSM or mass.  +BS LE:  No edema Skin with thickening and hyperpigmentation at folds       Assessment & Plan:  1.  Morbid obesity:  Likely driving much of her problem with uterine bleeding as noted previously.  Will call and speak with gynecology about other possible treatement other than Megace with her 24 lb weight gain  in past 2 years.   Long discussion on the type and amount of food she eats.   Recommended regular physical activity Check CBC, CMP, TSH with fasting labs  2.  Possible Sleep apnea:  Referral for sleep study.  Not sure if we can obtain at low cost.  Discussed weight concerns and SA.  3.  Polyuria:  Pt. Will return for fasting labs, including A1C next wee   4.  Dysthymia:  Needs support for dysthymia and weight loss:  Referral to Nilda SimmerNatosha Knight, LCSW for counseling

## 2015-07-17 NOTE — Patient Instructions (Signed)

## 2015-07-18 ENCOUNTER — Encounter: Payer: Self-pay | Admitting: Internal Medicine

## 2015-07-22 ENCOUNTER — Other Ambulatory Visit: Payer: No Typology Code available for payment source

## 2015-07-28 ENCOUNTER — Other Ambulatory Visit: Payer: No Typology Code available for payment source | Admitting: Licensed Clinical Social Worker

## 2015-08-10 ENCOUNTER — Other Ambulatory Visit: Payer: No Typology Code available for payment source | Admitting: Licensed Clinical Social Worker

## 2016-03-22 ENCOUNTER — Other Ambulatory Visit: Payer: Self-pay | Admitting: Obstetrics & Gynecology

## 2016-03-22 DIAGNOSIS — N939 Abnormal uterine and vaginal bleeding, unspecified: Secondary | ICD-10-CM

## 2016-03-22 DIAGNOSIS — D500 Iron deficiency anemia secondary to blood loss (chronic): Secondary | ICD-10-CM

## 2016-09-10 IMAGING — US US TRANSVAGINAL NON-OB
1 series · 14 of 25 positions shown · non-contrast
Comparison: None

CLINICAL DATA: Vaginal pain and bleeding.

EXAM:
TRANSABDOMINAL AND TRANSVAGINAL ULTRASOUND OF PELVIS
TECHNIQUE: Both transabdominal and transvaginal ultrasound examinations of the
pelvis were performed. Transabdominal technique was performed for
global imaging of the pelvis including uterus, ovaries, adnexal
regions, and pelvic cul-de-sac. It was necessary to proceed with
endovaginal exam following the transabdominal exam to visualize the
uterus, endometrium and ovaries..

[Series 1: us pelvis complete · 14 of 61 slices shown]
[im 1/61]
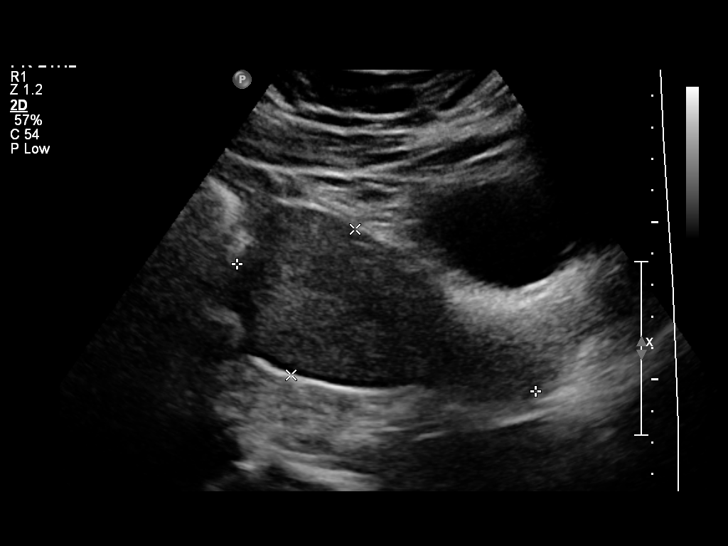
[im 6/61]
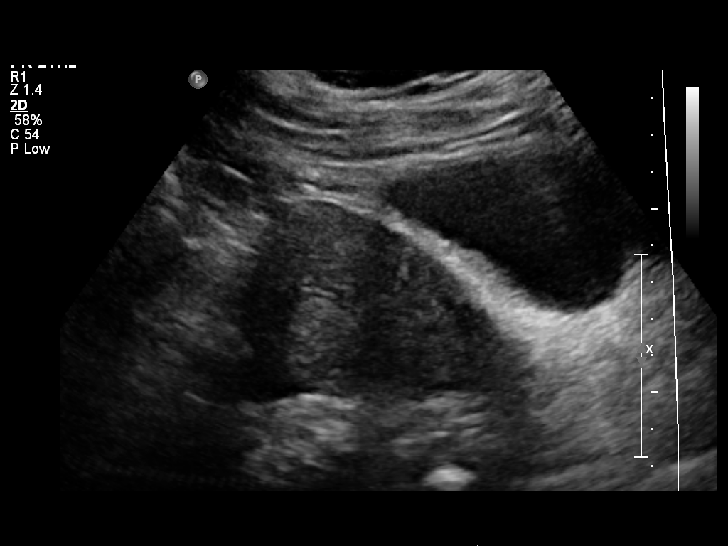
[im 11/61]
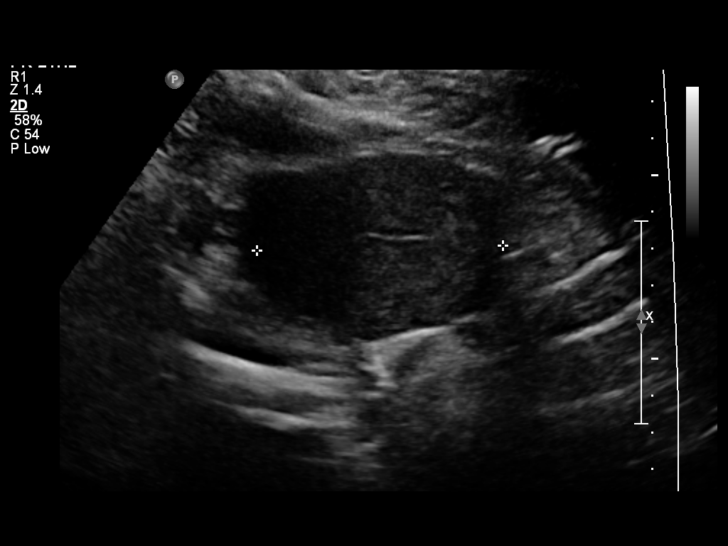
[im 16/61]
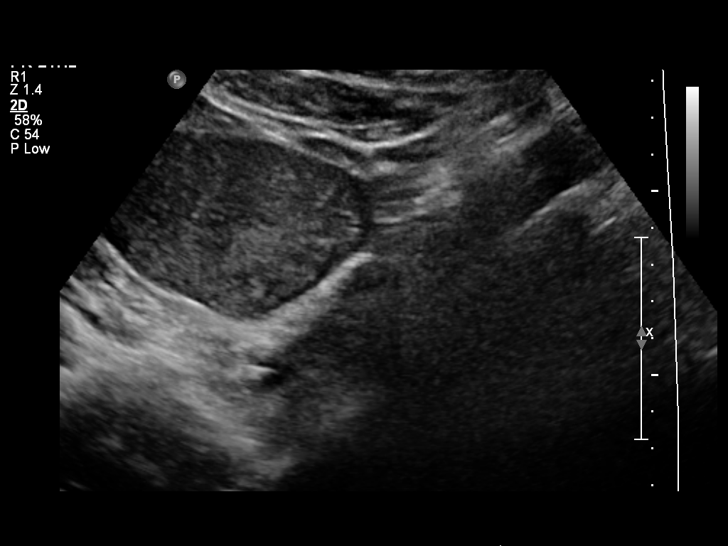
[im 21/61]
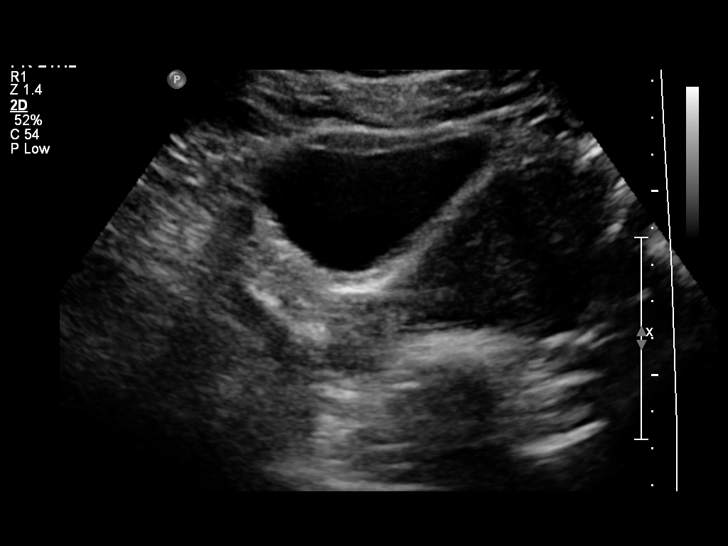
[im 23/61]
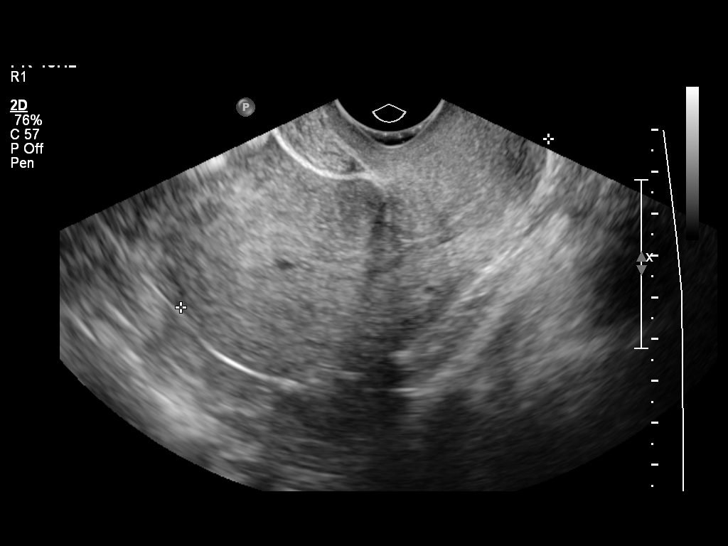
[im 28/61]
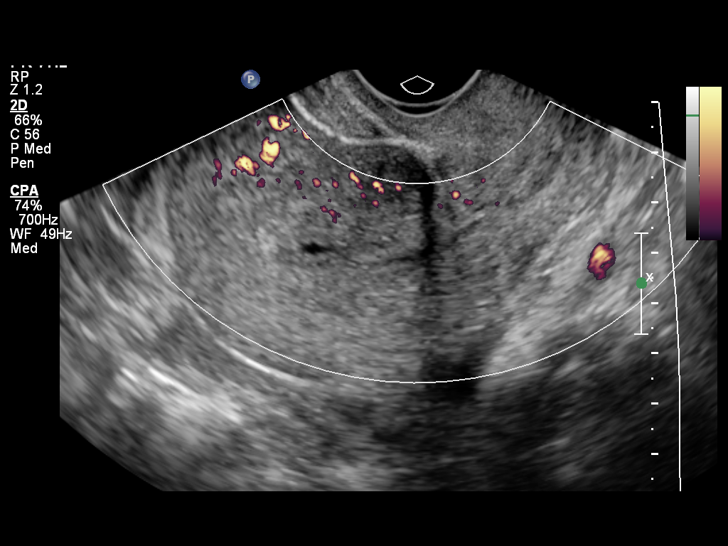
[im 33/61]
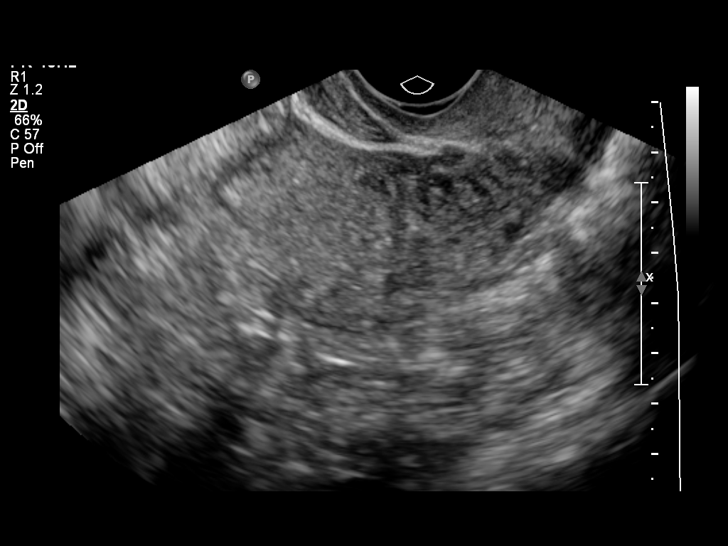
[im 38/61]
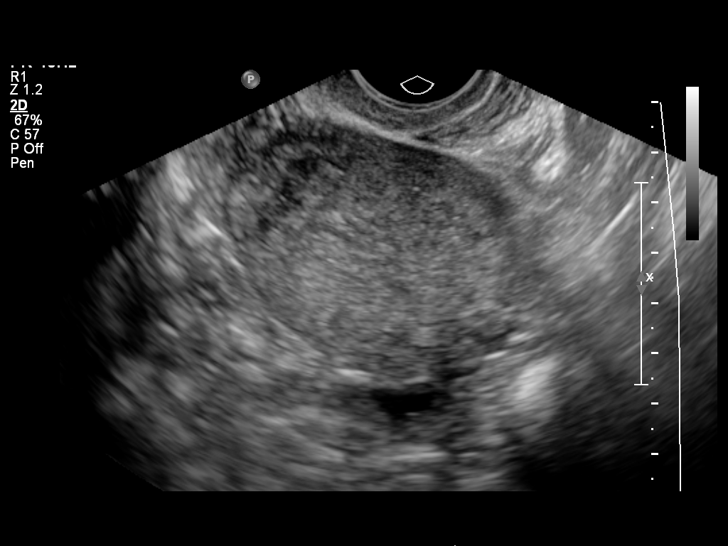
[im 41/61]
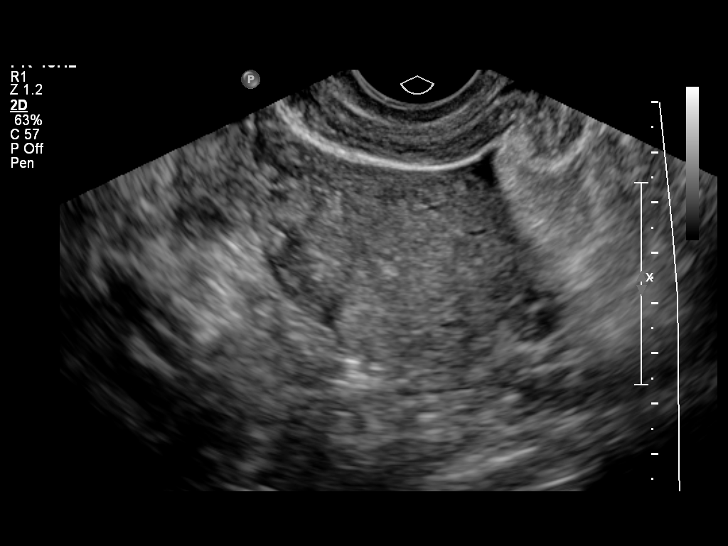
[im 46/61]
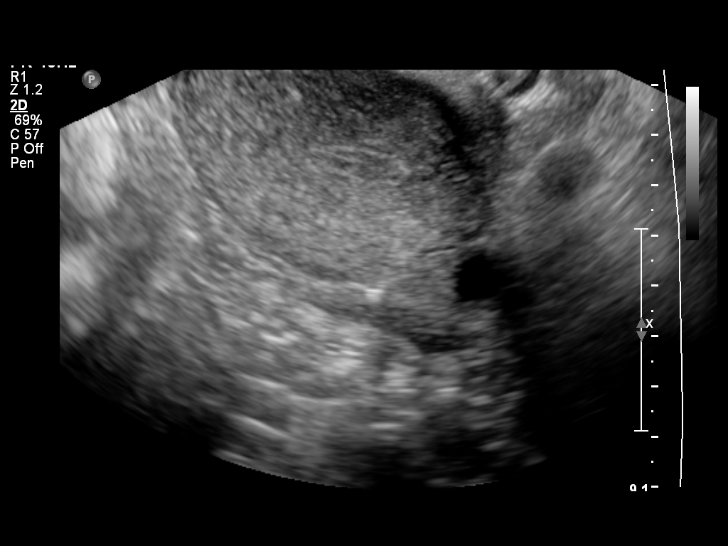
[im 51/61]
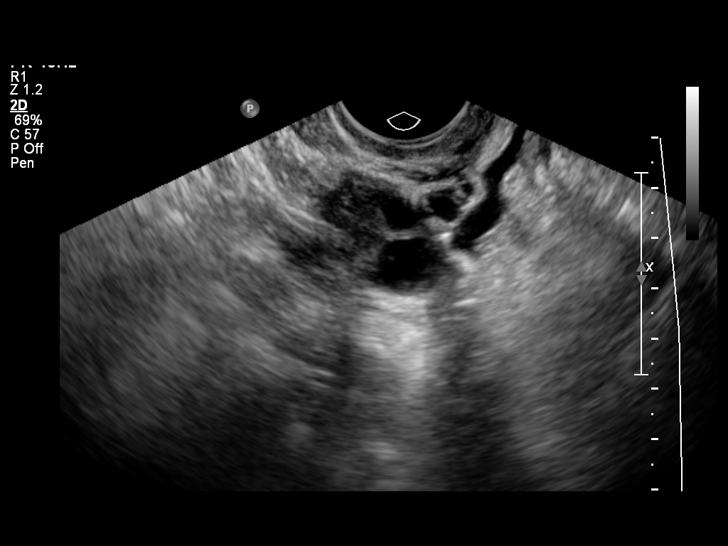
[im 56/61]
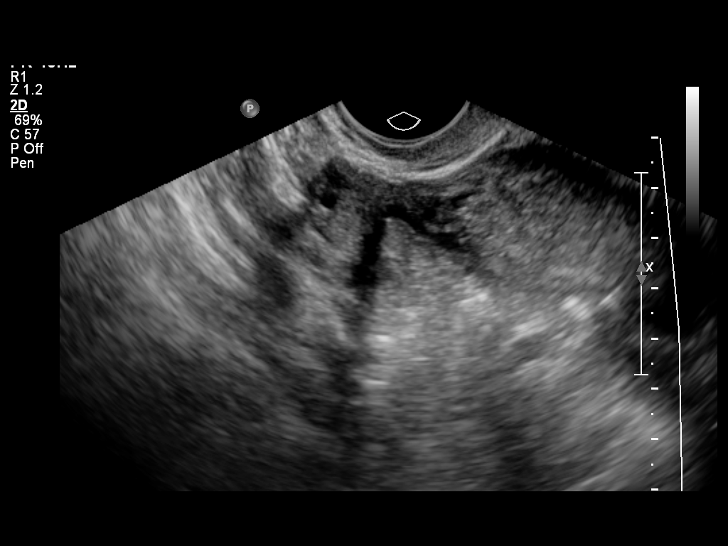
[im 61/61]
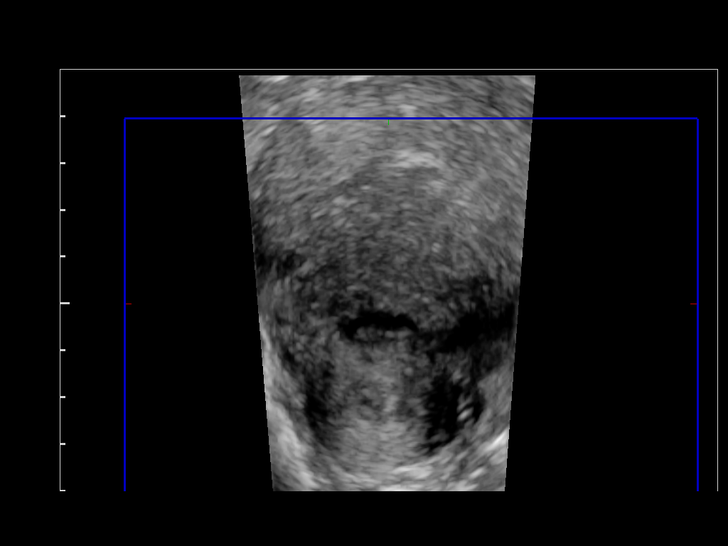

[14 of 25 positions shown; findings below may reference images not displayed]

FINDINGS: Uterus

Measurements: 9.7 x 4.9 x 6.1 cm.. No fibroids or other mass
visualized.

Endometrium

Thickness: 9.3 mm. No IUD identified.. No focal abnormality
visualized.

Right ovary

Measurements: 3.9 x 2.6 x 2.0 cm.. Normal appearance/no adnexal
mass.

Left ovary

Measurements: 3.8 x 2.1 x 2.4 cm. Normal appearance/no adnexal mass.

Other findings

No free fluid.
IMPRESSION: 1. Normal pelvic sonogram. No findings to explain patient's vaginal
pain.

## 2016-12-17 ENCOUNTER — Emergency Department (HOSPITAL_COMMUNITY)
Admission: EM | Admit: 2016-12-17 | Discharge: 2016-12-17 | Disposition: A | Discharge status: Home / Self Care | Payer: Self-pay | Attending: Emergency Medicine | Admitting: Emergency Medicine

## 2016-12-17 ENCOUNTER — Encounter (HOSPITAL_COMMUNITY): Payer: Self-pay | Admitting: Emergency Medicine

## 2016-12-17 DIAGNOSIS — Z79899 Other long term (current) drug therapy: Secondary | ICD-10-CM | POA: Insufficient documentation

## 2016-12-17 DIAGNOSIS — N939 Abnormal uterine and vaginal bleeding, unspecified: Secondary | ICD-10-CM | POA: Insufficient documentation

## 2016-12-17 DIAGNOSIS — R5383 Other fatigue: Secondary | ICD-10-CM | POA: Insufficient documentation

## 2016-12-17 DIAGNOSIS — R531 Weakness: Secondary | ICD-10-CM | POA: Insufficient documentation

## 2016-12-17 LAB — URINALYSIS, ROUTINE W REFLEX MICROSCOPIC
Bilirubin Urine: NEGATIVE
Glucose, UA: NEGATIVE mg/dL
Ketones, ur: NEGATIVE mg/dL
LEUKOCYTES UA: NEGATIVE
NITRITE: NEGATIVE
PH: 5 (ref 5.0–8.0)
Protein, ur: NEGATIVE mg/dL
SPECIFIC GRAVITY, URINE: 1.03 (ref 1.005–1.030)

## 2016-12-17 LAB — COMPREHENSIVE METABOLIC PANEL
ALK PHOS: 77 U/L (ref 38–126)
ALT: 21 U/L (ref 14–54)
AST: 22 U/L (ref 15–41)
Albumin: 3.3 g/dL — ABNORMAL LOW (ref 3.5–5.0)
Anion gap: 6 (ref 5–15)
BUN: 9 mg/dL (ref 6–20)
CALCIUM: 8.8 mg/dL — AB (ref 8.9–10.3)
CO2: 22 mmol/L (ref 22–32)
Chloride: 108 mmol/L (ref 101–111)
Creatinine, Ser: 0.56 mg/dL (ref 0.44–1.00)
GFR calc Af Amer: >60 mL/min (ref >60)
GFR calc non Af Amer: >60 mL/min (ref >60)
Glucose, Bld: 116 mg/dL — ABNORMAL HIGH (ref 65–99)
POTASSIUM: 3.3 mmol/L — AB (ref 3.5–5.1)
Sodium: 136 mmol/L (ref 135–145)
Total Bilirubin: 0.5 mg/dL (ref 0.3–1.2)
Total Protein: 7.2 g/dL (ref 6.5–8.1)

## 2016-12-17 LAB — CBC
HEMATOCRIT: 31.1 % — AB (ref 36.0–46.0)
Hemoglobin: 9.3 g/dL — ABNORMAL LOW (ref 12.0–15.0)
MCH: 23.5 pg — ABNORMAL LOW (ref 26.0–34.0)
MCHC: 29.9 g/dL — ABNORMAL LOW (ref 30.0–36.0)
MCV: 78.7 fL (ref 78.0–100.0)
PLATELETS: 361 10*3/uL (ref 150–400)
RBC: 3.95 MIL/uL (ref 3.87–5.11)
RDW: 15.8 % — AB (ref 11.5–15.5)
WBC: 8.6 10*3/uL (ref 4.0–10.5)

## 2016-12-17 LAB — POC URINE PREG, ED: Preg Test, Ur: NEGATIVE

## 2016-12-17 LAB — WET PREP, GENITAL
Clue Cells Wet Prep HPF POC: NONE SEEN
Sperm: NONE SEEN
Trich, Wet Prep: NONE SEEN
Yeast Wet Prep HPF POC: NONE SEEN

## 2016-12-17 MED ORDER — MEGESTROL ACETATE 20 MG PO TABS: 40.0000 mg | ORAL_TABLET | Freq: Two times a day (BID) | ORAL | 0 refills | Status: DC

## 2016-12-17 NOTE — ED Triage Notes (Signed)
Pt states she has been having vaginal bleeding for the last 28 days, pt states at this time it isn't heavy but has been constant.

## 2016-12-17 NOTE — Discharge Instructions (Signed)
Es importante que toma mucha agua.  Continua a tomar el hierro y comdias con mucho hierro.  Toma el megestrol cada dia.  Da Neomia Dearuna cita con el ginecologo.  Regresa al ER si tiene dolor de [place], mas sueno/debil o otros sintomas nuevas  Is important that you drink lots of water. Continue to take your iron pills and eat foods with lots of iron. Take the megestrol every day. Make an appointment with your gynecologist for further evaluation. Return to emergency room if you have abdominal pain, worsening tiredness or weakness, or any new or worsening symptoms.

## 2016-12-17 NOTE — ED Provider Notes (Signed)
[facility]-EMERGENCY DEPT Provider Note   CSN: [id number] Arrival date & time: 12/17/16  1321     History   Chief Complaint Chief Complaint  Patient presents with  . Vaginal Bleeding    HPI Nancy Romero is a 36 y.o. female presenting with 4 weeks vaginal bleeding.   Patient states she has a history of menometrorrhagia, for which she has been seen by women's health. She was started on megestrol last year, and her periods became regular and one-week long. She now takes it intermittently, as needed for bleeding. She comes in today, as she has been bleeding for the past 4 weeks. She reports some days the bleeding is heavy, and some days it is light. She took 2 megestrol pills last night without relief of bleeding. The bleeding is usually liquid, but occasionally clots. She denies abdominal pain. She states she is starting to feel more weak and tired. She has needed a blood transfusion before due to heavy menstrual bleeding in 2016. She is taking iron pills. She denies fever, chills, chest pain, shortness of breath, nausea, vomiting, abdominal pain, urinary symptoms, or abnormal bowel movements. She states there is no chance she is pregnant, but is not using birth control. She had an IUD placed in 2015, but ultrasound 2016 showed no IUD. Last visit with OB/GYN was >1 year ago.  HPI  Past Medical History:  Diagnosis Date  . Anemia     Patient Active Problem List   Diagnosis Date Noted  . Abnormal uterine bleeding (AUB) 12/24/2014  . Language barrier, speaks Spanish only 10/30/2013  . Obesity, Class III, BMI 40-49.9 (morbid obesity) (HCC) 10/30/2013  . Chronic female pelvic pain 10/30/2013    Past Surgical History:  Procedure Laterality Date  . CESAREAN SECTION      x 2    OB History    Gravida Para Term Preterm AB Living   2 2 2     2    SAB TAB Ectopic Multiple Live Births                   Home Medications    Prior to Admission medications   Medication Sig Start  Date End Date Taking? Authorizing Provider  ferrous sulfate 325 (65 FE) MG tablet TAKE ONE TABLET BY MOUTH TWICE DAILY WITH A MEAL. 03/22/16  Yes Anyanwu, Jethro Bastos, MD  Multiple Vitamin (MULTIVITAMIN) tablet Take 1 tablet by mouth daily.   Yes [provider]  megestrol (MEGACE) 20 MG tablet Take 2 tablets (40 mg total) by mouth 2 (two) times daily. 12/17/16   Octa Uplinger, PA-C    Family History Family History  Problem Relation Age of Onset  . Alcohol abuse Neg Hx   . Arthritis Neg Hx   . Asthma Neg Hx   . Birth defects Neg Hx   . Cancer Neg Hx   . COPD Neg Hx   . Depression Neg Hx   . Diabetes Neg Hx   . Drug abuse Neg Hx   . Early death Neg Hx   . Hearing loss Neg Hx   . Heart disease Neg Hx   . Hyperlipidemia Neg Hx   . Hypertension Neg Hx   . Kidney disease Neg Hx   . Learning disabilities Neg Hx   . Mental illness Neg Hx   . Mental retardation Neg Hx   . Miscarriages / Stillbirths Neg Hx   . Stroke Neg Hx   . Vision loss Neg Hx   .  Varicose Veins Neg Hx     Social History Social History  Substance Use Topics  . Smoking status: Never Smoker  . Smokeless tobacco: Not on file  . Alcohol use No     Allergies   Patient has no known allergies.   Review of Systems Review of Systems  Constitutional: Negative for chills and fever.  Gastrointestinal: Negative for abdominal pain, blood in stool, constipation, diarrhea, nausea and vomiting.  Genitourinary: Positive for menstrual problem and vaginal bleeding. Negative for dysuria, frequency, hematuria, pelvic pain, vaginal discharge and vaginal pain.  Hematological: Does not bruise/bleed easily.     Physical Exam Updated Vital Signs BP 104/62   Pulse 66   Temp 98.5 F (36.9 C) (Oral)   Resp 16   Wt 111.6 kg (246 lb)   LMP 11/22/2016   SpO2 100%   BMI 48.04 kg/m   Physical Exam  Constitutional: She is oriented to person, place, and time. She appears well-developed and well-nourished. No  distress.  HENT:  Head: Normocephalic and atraumatic.  Mouth/Throat: Mucous membranes are normal. Mucous membranes are not pale and not dry.  Eyes: Pupils are equal, round, and reactive to light. Conjunctivae and EOM are normal.  Neck: Normal range of motion. Neck supple.  Cardiovascular: Normal rate, regular rhythm and intact distal pulses.   Pulmonary/Chest: Effort normal and breath sounds normal. No respiratory distress. She has no wheezes.  Abdominal: Soft. Bowel sounds are normal. She exhibits no distension. There is no tenderness.  Genitourinary: Rectum normal and uterus normal. Pelvic exam was performed with patient supine. There is no rash, tenderness or lesion on the right labia. There is no rash, tenderness or lesion on the left labia. Cervix exhibits no motion tenderness, no discharge and no friability. Right adnexum displays no mass, no tenderness and no fullness. Left adnexum displays no mass, no tenderness and no fullness. No foreign body in the vagina. No vaginal discharge found.  Genitourinary Comments: Chaperone present. No obvious masses or lesions. Bleeding noted, no discharge. No clot seen. No cervical motion tenderness.  Musculoskeletal: Normal range of motion.  Neurological: She is alert and oriented to person, place, and time.  Skin: Skin is warm and dry.  Psychiatric: She has a normal mood and affect.  Nursing note and vitals reviewed.    ED Treatments / Results  Labs (all labs ordered are listed, but only abnormal results are displayed) Labs Reviewed  WET PREP, GENITAL - Abnormal; Notable for the following:       Result Value   WBC, Wet Prep HPF POC RARE (*)    All other components within normal limits  COMPREHENSIVE METABOLIC PANEL - Abnormal; Notable for the following:    Potassium 3.3 (*)    Glucose, Bld 116 (*)    Calcium 8.8 (*)    Albumin 3.3 (*)    All other components within normal limits  CBC - Abnormal; Notable for the following:    Hemoglobin 9.3  (*)    HCT 31.1 (*)    MCH 23.5 (*)    MCHC 29.9 (*)    RDW 15.8 (*)    All other components within normal limits  URINALYSIS, ROUTINE W REFLEX MICROSCOPIC - Abnormal; Notable for the following:    APPearance TURBID (*)    Hgb urine dipstick LARGE (*)    Bacteria, UA RARE (*)    Squamous Epithelial / LPF 0-5 (*)    All other components within normal limits  POC URINE PREG, ED  GC/CHLAMYDIA PROBE AMP ([place]) NOT AT [facility]    EKG  EKG Interpretation None       Radiology No results found.  Procedures Procedures (including critical care time)  Medications Ordered in ED Medications - No data to display   Initial Impression / Assessment and Plan / ED Course  I have reviewed the triage vital signs and the nursing notes.  Pertinent labs & imaging results that were available during my care of the patient were reviewed by me and considered in my medical decision making (see chart for details).     Patient presented with 4 weeks of vaginal bleeding. She has needed a blood transfusion in the past due to this. She reports some increased tiredness and weakness. She has not seen OB/GYN for this issue yet. Physical exam reassuring, as patient does not appear pale or dry. Pelvic exam shows bleeding without clots. No discharge. Wet prep and UA negative. CBC shows stable hemoglobin at 9.3. CMP reassuring. Pregnancy negative. Discussed findings with patient. Patient follow-up with OB/GYN. At this time, patient appears safe for discharge. Return precautions given. Patient states she understands and agrees to plan.  Final Clinical Impressions(s) / ED Diagnoses   Final diagnoses:  Vaginal bleeding    New Prescriptions Discharge Medication List as of 12/17/2016  5:45 PM       Alveria ApleyCaccavale, Dalin Caldera, PA-C 12/17/16 2252    Margarita Grizzleay, Danielle, MD 12/18/16 2311

## 2016-12-17 NOTE — ED Notes (Signed)
ED Provider at bedside. 

## 2016-12-22 LAB — GC/CHLAMYDIA PROBE AMP (~~LOC~~) NOT AT ARMC
Chlamydia: NEGATIVE
Neisseria Gonorrhea: NEGATIVE

## 2017-06-12 ENCOUNTER — Other Ambulatory Visit: Payer: Self-pay

## 2017-06-19 LAB — CYTOLOGY - PAP: Diagnosis: NEGATIVE

## 2017-10-06 DIAGNOSIS — N941 Unspecified dyspareunia: Secondary | ICD-10-CM | POA: Insufficient documentation

## 2017-10-06 DIAGNOSIS — R748 Abnormal levels of other serum enzymes: Secondary | ICD-10-CM | POA: Insufficient documentation

## 2017-10-06 DIAGNOSIS — L83 Acanthosis nigricans: Secondary | ICD-10-CM | POA: Insufficient documentation

## 2017-10-24 DIAGNOSIS — E119 Type 2 diabetes mellitus without complications: Secondary | ICD-10-CM | POA: Insufficient documentation

## 2018-08-13 ENCOUNTER — Telehealth (INDEPENDENT_AMBULATORY_CARE_PROVIDER_SITE_OTHER): Payer: Self-pay | Admitting: Advanced Practice Midwife

## 2018-08-13 ENCOUNTER — Telehealth: Payer: Self-pay | Admitting: Advanced Practice Midwife

## 2018-08-13 DIAGNOSIS — Z029 Encounter for administrative examinations, unspecified: Secondary | ICD-10-CM

## 2018-08-13 NOTE — Telephone Encounter (Signed)
Stated she has a referral from a clinic due to bleeding. Has an orange card, informed of the financial application. Sending a message to the nurse. Informed the patient of scheduling after May due to the COVID19.

## 2018-08-14 NOTE — Telephone Encounter (Signed)
Opened in error

## 2018-08-16 NOTE — Telephone Encounter (Signed)
Called patient with pacific interpreter [phone] and stated I was returning her phone call about a refill. Patient states she was given a prescription for bleeding and has finally stopped bleeding but is out of medication. Patient states she wasn't sure if she should contact us since she was referred here or if she should ask the doctor that prescribed it. Advised patient to request refill from prescribing provider. Patient verbalized understanding & had no questions.

## 2019-03-18 ENCOUNTER — Ambulatory Visit (INDEPENDENT_AMBULATORY_CARE_PROVIDER_SITE_OTHER): Payer: Self-pay | Admitting: Obstetrics & Gynecology

## 2019-03-18 ENCOUNTER — Other Ambulatory Visit: Payer: Self-pay

## 2019-03-18 VITALS — BP 132/82 | HR 72 | Ht 63.0 in | Wt 234.5 lb

## 2019-03-18 DIAGNOSIS — N939 Abnormal uterine and vaginal bleeding, unspecified: Secondary | ICD-10-CM

## 2019-03-18 MED ORDER — MEDROXYPROGESTERONE ACETATE 10 MG PO TABS: ORAL_TABLET | ORAL | 2 refills | Status: DC

## 2019-03-18 MED ORDER — METFORMIN HCL 500 MG PO TABS: 500.0000 mg | ORAL_TABLET | Freq: Every day | ORAL | 7 refills | Status: DC

## 2019-03-18 MED ORDER — METFORMIN HCL 500 MG PO TABS: 500.0000 mg | ORAL_TABLET | Freq: Every day | ORAL | 7 refills | Status: AC

## 2019-03-18 NOTE — Patient Instructions (Signed)
Sangrado uterino anormal Abnormal Uterine Bleeding El sangrado uterino anormal sucede cuando el sangrado del tero es ms duradero o ms abundante que lo normal. Esto puede incluir lo siguiente:  Hemorragias entre los perodos menstruales.  Sangrado luego de mantener relaciones sexuales.  Sangrado ms abundante que lo normal.  Perodos que duran ms de lo normal.  Sangrado durante la menopausia. Esta afeccin puede ser causada por muchos problemas. Si tiene un sangrado que no considera normal, debe consultar al mdico. El tratamiento depende de la causa del sangrado. Siga estas indicaciones en su casa:  Controle su afeccin para detectar cualquier cambio.  No use tampones, no se haga duchas vaginales ni mantenga relaciones sexuales si su mdico le dice que no lo haga.  Cambie los apsitos con frecuencia.  Realcese los exmenes de rutina para mujeres. Asegrese de hacerse un examen plvico y de control de cncer de cuello uterino.  Concurra a todas las visitas de control como se lo haya indicado el mdico. Esto es importante. Comunquese con un mdico si:  El sangrado dura ms de una semana.  Se siente mareada por momentos.  Siente que va a vomitar (nuseas).  Vomita. Solicite ayuda de inmediato si:  Se desmaya.  Debe cambiarse los apsitos cada hora.  Siente dolor de estmago (abdominal).  Tiene fiebre.  Transpira.  Se debilita.  Elimina cogulos de sangre grandes por la vagina. Resumen  El sangrado uterino anormal sucede cuando el sangrado del tero es ms duradero o ms abundante que lo normal.  Esta afeccin puede ser causada por muchos problemas. Si tiene un sangrado que no considera normal, debe consultar al mdico.  El tratamiento depende de la causa del sangrado. Esta informacin no tiene como fin reemplazar el consejo del mdico. Asegrese de hacerle al mdico cualquier pregunta que tenga. Document Released: 05/07/2010 Document Revised: 02/07/2017  Document Reviewed: 09/27/2016 Elsevier Patient Education  2020 Elsevier Inc.  

## 2019-03-18 NOTE — Progress Notes (Signed)
Patient ID: Nancy Romero, female   DOB: 09-03-1980, 38 y.o.   MRN: [id number]  Chief Complaint  Patient presents with  . Gynecologic Exam    HPI Nancy Romero is a 38 y.o. female.  [id number] Patient's last menstrual period was 12/09/2018 (approximate). Patient continues to have irregular sometimes heavy menses which was previously treated with progestins. She wants cycle control but would like to be able to conceive. She has been told she has prediabetes and was offered metformin by her PCP. HPI  Past Medical History:  Diagnosis Date  . Anemia     Past Surgical History:  Procedure Laterality Date  . CESAREAN SECTION      x 2    Family History  Problem Relation Age of Onset  . Alcohol abuse Neg Hx   . Arthritis Neg Hx   . Asthma Neg Hx   . Birth defects Neg Hx   . Cancer Neg Hx   . COPD Neg Hx   . Depression Neg Hx   . Diabetes Neg Hx   . Drug abuse Neg Hx   . Early death Neg Hx   . Hearing loss Neg Hx   . Heart disease Neg Hx   . Hyperlipidemia Neg Hx   . Hypertension Neg Hx   . Kidney disease Neg Hx   . Learning disabilities Neg Hx   . Mental illness Neg Hx   . Mental retardation Neg Hx   . Miscarriages / Stillbirths Neg Hx   . Stroke Neg Hx   . Vision loss Neg Hx   . Varicose Veins Neg Hx     Social History Social History   Tobacco Use  . Smoking status: Never Smoker  Substance Use Topics  . Alcohol use: No  . Drug use: No    No Known Allergies  Current Outpatient Medications  Medication Sig Dispense Refill  . ferrous sulfate 325 (65 FE) MG tablet TAKE ONE TABLET BY MOUTH TWICE DAILY WITH A MEAL. 60 tablet 6  . Multiple Vitamin (MULTIVITAMIN) tablet Take 1 tablet by mouth daily.    . medroxyPROGESTERone (PROVERA) 10 MG tablet Daily for 10 days for cycle control when menses is 2-4 weeks late 30 tablet 2  . megestrol (MEGACE) 20 MG tablet Take 2 tablets (40 mg total) by mouth 2 (two) times daily. (Patient not taking: Reported on  03/18/2019) 60 tablet 0  . metFORMIN (GLUCOPHAGE) 500 MG tablet Take 1 tablet (500 mg total) by mouth daily with supper. 30 tablet 7   No current facility-administered medications for this visit.     Review of Systems Review of Systems  Constitutional: Negative.   Respiratory: Negative.   Gastrointestinal: Negative.   Genitourinary: Positive for menstrual problem. Negative for vaginal bleeding and vaginal discharge.    Blood pressure 132/82, pulse 72, height 5\' 3"  (1.6 m), weight 234 lb 8 oz (106.4 kg), last menstrual period 12/09/2018.  Physical Exam Physical Exam Constitutional:      Appearance: She is obese. She is not ill-appearing.  Pulmonary:     Effort: Pulmonary effort is normal.  Skin:    General: Skin is warm.  Neurological:     Mental Status: She is alert.  Psychiatric:        Mood and Affect: Mood normal.        Behavior: Behavior normal.     Data Reviewed CLINICAL DATA:  Vaginal pain and bleeding.  EXAM: TRANSABDOMINAL AND TRANSVAGINAL ULTRASOUND OF PELVIS  TECHNIQUE: Both transabdominal  and transvaginal ultrasound examinations of the pelvis were performed. Transabdominal technique was performed for global imaging of the pelvis including uterus, ovaries, adnexal regions, and pelvic cul-de-sac. It was necessary to proceed with endovaginal exam following the transabdominal exam to visualize the uterus, endometrium and ovaries.  COMPARISON:  None  FINDINGS: Uterus  Measurements: 9.7 x 4.9 x 6.1 cm. No fibroids or other mass visualized.  Endometrium  Thickness: 9.3 mm. No IUD identified. No focal abnormality visualized.  Right ovary  Measurements: 3.9 x 2.6 x 2.0 cm. Normal appearance/no adnexal mass.  Left ovary  Measurements: 3.8 x 2.1 x 2.4 cm. Normal appearance/no adnexal mass.  Other findings  No free fluid.  IMPRESSION: 1. Normal pelvic sonogram. No findings to explain patient's vaginal pain.   Electronically  Signed   By: Kerby Moors M.D.   On: 11/19/2014 13:09  Assessment DUB likely chronic anovualtion H/o prediabetes/insulin insensitivity which can contribute to anovulation and infertility  Plan Metformin 500 mg daily Provera for cycle control Outpatient Encounter Medications as of 03/18/2019  Medication Sig  . ferrous sulfate 325 (65 FE) MG tablet TAKE ONE TABLET BY MOUTH TWICE DAILY WITH A MEAL.  . Multiple Vitamin (MULTIVITAMIN) tablet Take 1 tablet by mouth daily.  . medroxyPROGESTERone (PROVERA) 10 MG tablet Daily for 10 days for cycle control when menses is 2-4 weeks late  . megestrol (MEGACE) 20 MG tablet Take 2 tablets (40 mg total) by mouth 2 (two) times daily. (Patient not taking: Reported on 03/18/2019)  . metFORMIN (GLUCOPHAGE) 500 MG tablet Take 1 tablet (500 mg total) by mouth daily with supper.  . [DISCONTINUED] medroxyPROGESTERone (PROVERA) 10 MG tablet Daily for 10 days for cycle control when menses is 2-4 weeks late  . [DISCONTINUED] metFORMIN (GLUCOPHAGE) 500 MG tablet Take 1 tablet (500 mg total) by mouth daily with supper.   No facility-administered encounter medications on file as of 03/18/2019.    RTC 6 months     Emeterio Reeve 03/18/2019, 3:22 PM

## 2019-08-09 ENCOUNTER — Ambulatory Visit: Payer: Self-pay | Attending: Internal Medicine

## 2019-08-09 DIAGNOSIS — Z23 Encounter for immunization: Secondary | ICD-10-CM

## 2019-08-09 NOTE — Progress Notes (Signed)
   Covid-19 Vaccination Clinic  Name:  Yesmin Mutch    MRN: [id number] DOB: Dec 12, 1980  08/09/2019  Ms. Gutierrez-Leal was observed post Covid-19 immunization for 15 minutes without incident. She was provided with Vaccine Information Sheet and instruction to access the V-Safe system.   Ms. Otter was instructed to call 911 with any severe reactions post vaccine: Marland Kitchen Difficulty breathing  . Swelling of face and throat  . A fast heartbeat  . A bad rash all over body  . Dizziness and weakness   Immunizations Administered    Name Date Dose VIS Date Route   Pfizer COVID-19 Vaccine 08/09/2019  5:18 PM 0.3 mL 06/12/2018 Intramuscular   Manufacturer: [employer], [employer]   Lot: [id number]   NDC: [address]-2

## 2019-09-02 ENCOUNTER — Ambulatory Visit: Payer: Self-pay | Attending: Internal Medicine

## 2019-09-02 DIAGNOSIS — Z23 Encounter for immunization: Secondary | ICD-10-CM

## 2019-09-02 NOTE — Progress Notes (Signed)
   Covid-19 Vaccination Clinic  Name:  Noe Pittsley    MRN: [id number] DOB: Sep 14, 1980  09/02/2019  Ms. Gutierrez-Leal was observed post Covid-19 immunization for 15 minutes without incident. She was provided with Vaccine Information Sheet and instruction to access the V-Safe system.   Ms. Vanderlinden was instructed to call 911 with any severe reactions post vaccine: Marland Kitchen Difficulty breathing  . Swelling of face and throat  . A fast heartbeat  . A bad rash all over body  . Dizziness and weakness   Immunizations Administered    Name Date Dose VIS Date Route   Pfizer COVID-19 Vaccine 09/02/2019  5:12 PM 0.3 mL 06/12/2018 Intramuscular   Manufacturer: [employer], [employer]   Lot: [id number]   NDC: [address]-9      Covid-19 Vaccination Clinic  Name:  Necole Minassian    MRN: [id number] DOB: Dec 23, 1980  09/02/2019  Ms. Gutierrez-Leal was observed post Covid-19 immunization for 15 minutes without incident. She was provided with Vaccine Information Sheet and instruction to access the V-Safe system.   Ms. Victoria was instructed to call 911 with any severe reactions post vaccine: Marland Kitchen Difficulty breathing  . Swelling of face and throat  . A fast heartbeat  . A bad rash all over body  . Dizziness and weakness   Immunizations Administered    Name Date Dose VIS Date Route   Pfizer COVID-19 Vaccine 09/02/2019  5:12 PM 0.3 mL 06/12/2018 Intramuscular   Manufacturer: [employer], [employer]   Lot: [id number]   NDC: [address]-0

## 2019-12-12 DIAGNOSIS — R809 Proteinuria, unspecified: Secondary | ICD-10-CM | POA: Insufficient documentation

## 2019-12-12 DIAGNOSIS — D72820 Lymphocytosis (symptomatic): Secondary | ICD-10-CM | POA: Insufficient documentation

## 2020-01-09 ENCOUNTER — Ambulatory Visit (INDEPENDENT_AMBULATORY_CARE_PROVIDER_SITE_OTHER): Payer: Self-pay | Admitting: Obstetrics & Gynecology

## 2020-01-09 ENCOUNTER — Encounter: Payer: Self-pay | Admitting: Obstetrics & Gynecology

## 2020-01-09 ENCOUNTER — Other Ambulatory Visit: Payer: Self-pay

## 2020-01-09 VITALS — BP 120/71 | HR 70 | Ht 63.0 in | Wt 225.9 lb

## 2020-01-09 DIAGNOSIS — N939 Abnormal uterine and vaginal bleeding, unspecified: Secondary | ICD-10-CM

## 2020-01-09 DIAGNOSIS — R102 Pelvic and perineal pain: Secondary | ICD-10-CM

## 2020-01-09 DIAGNOSIS — G8929 Other chronic pain: Secondary | ICD-10-CM

## 2020-01-09 LAB — CBC
Hematocrit: 26.5 % — ABNORMAL LOW (ref 34.0–46.6)
Hemoglobin: 8.4 g/dL — ABNORMAL LOW (ref 11.1–15.9)
MCH: 27.7 pg (ref 26.6–33.0)
MCHC: 31.7 g/dL (ref 31.5–35.7)
MCV: 88 fL (ref 79–97)
Platelets: 323 10*3/uL (ref 150–450)
RBC: 3.03 x10E6/uL — ABNORMAL LOW (ref 3.77–5.28)
RDW: 16.5 % — ABNORMAL HIGH (ref 11.7–15.4)
WBC: 9 10*3/uL (ref 3.4–10.8)

## 2020-01-09 MED ORDER — NORETHINDRONE ACET-ETHINYL EST 1.5-30 MG-MCG PO TABS: 1.0000 | ORAL_TABLET | Freq: Every day | ORAL | 11 refills | Status: DC

## 2020-01-09 NOTE — Patient Instructions (Signed)
Sangrado uterino anormal Abnormal Uterine Bleeding El sangrado uterino anormal sucede cuando el sangrado del tero es ms duradero o ms abundante que lo normal. Esto puede incluir lo siguiente:  Hemorragias entre los perodos menstruales.  Sangrado luego de mantener relaciones sexuales.  Sangrado ms abundante que lo normal.  Perodos que duran ms de lo normal.  Sangrado durante la menopausia. Esta afeccin puede ser causada por muchos problemas. Si tiene un sangrado que no considera normal, debe consultar al mdico. El tratamiento depende de la causa del sangrado. Siga estas indicaciones en su casa:  Controle su afeccin para detectar cualquier cambio.  No use tampones, no se haga duchas vaginales ni mantenga relaciones sexuales si su mdico le dice que no lo haga.  Cambie los apsitos con frecuencia.  Realcese los exmenes de rutina para mujeres. Asegrese de hacerse un examen plvico y de control de cncer de cuello uterino.  Concurra a todas las visitas de control como se lo haya indicado el mdico. Esto es importante. Comunquese con un mdico si:  El sangrado dura ms de una semana.  Se siente mareada por momentos.  Siente que va a vomitar (nuseas).  Vomita. Solicite ayuda de inmediato si:  Se desmaya.  Debe cambiarse los apsitos cada hora.  Siente dolor de estmago (abdominal).  Tiene fiebre.  Transpira.  Se debilita.  Elimina cogulos de sangre grandes por la vagina. Resumen  El sangrado uterino anormal sucede cuando el sangrado del tero es ms duradero o ms abundante que lo normal.  Esta afeccin puede ser causada por muchos problemas. Si tiene un sangrado que no considera normal, debe consultar al mdico.  El tratamiento depende de la causa del sangrado. Esta informacin no tiene como fin reemplazar el consejo del mdico. Asegrese de hacerle al mdico cualquier pregunta que tenga. Document Revised: 02/07/2017 Document Reviewed: 09/27/2016  Elsevier Patient Education  2020 Elsevier Inc.  

## 2020-01-09 NOTE — Progress Notes (Signed)
Here for follow up of bleeding .States bleeding not better States still bleeding everyday - somedays a little; sometimes a lot. States also went to other clinic. Also c/o feeling so tired she can't even work.  Javares Kaufhold,RN

## 2020-01-09 NOTE — Progress Notes (Signed)
Patient ID: Nancy Romero, female   DOB: [birth year], 39 y.o.   MRN: [id number]  Chief Complaint  Patient presents with  . Follow-up    HPI Nancy Romero is a 39 y.o. female.  [id number] No LMP recorded. (Menstrual status: Irregular Periods). She has bled continuously for 5 month with clots and cramps. She took provera that was prescribed for 10 days but no relief. She no longer is seeking pregnancy and she wants cycle control. HPI  Past Medical History:  Diagnosis Date  . Anemia     Past Surgical History:  Procedure Laterality Date  . CESAREAN SECTION      x 2    Family History  Problem Relation Age of Onset  . Alcohol abuse Neg Hx   . Arthritis Neg Hx   . Asthma Neg Hx   . Birth defects Neg Hx   . Cancer Neg Hx   . COPD Neg Hx   . Depression Neg Hx   . Diabetes Neg Hx   . Drug abuse Neg Hx   . Early death Neg Hx   . Hearing loss Neg Hx   . Heart disease Neg Hx   . Hyperlipidemia Neg Hx   . Hypertension Neg Hx   . Kidney disease Neg Hx   . Learning disabilities Neg Hx   . Mental illness Neg Hx   . Mental retardation Neg Hx   . Miscarriages / Stillbirths Neg Hx   . Stroke Neg Hx   . Vision loss Neg Hx   . Varicose Veins Neg Hx     Social History Social History   Tobacco Use  . Smoking status: Never Smoker  . Smokeless tobacco: Never Used  Vaping Use  . Vaping Use: Never used  Substance Use Topics  . Alcohol use: No  . Drug use: No    No Known Allergies  Current Outpatient Medications  Medication Sig Dispense Refill  . ferrous sulfate 325 (65 FE) MG tablet TAKE ONE TABLET BY MOUTH TWICE DAILY WITH A MEAL. 60 tablet 6  . IBU 800 MG tablet Take 800 mg by mouth 3 (three) times daily as needed.    Marland Kitchen lisinopril (ZESTRIL) 2.5 MG tablet Take 2.5 mg by mouth daily.    . medroxyPROGESTERone (PROVERA) 10 MG tablet Daily for 10 days for cycle control when menses is 2-4 weeks late 30 tablet 2  . metFORMIN (GLUCOPHAGE) 1000 MG tablet Take  1,000 mg by mouth 2 (two) times daily.    . Multiple Vitamin (MULTIVITAMIN) tablet Take 1 tablet by mouth daily.    . megestrol (MEGACE) 20 MG tablet Take 2 tablets (40 mg total) by mouth 2 (two) times daily. (Patient not taking: Reported on 03/18/2019) 60 tablet 0  . metFORMIN (GLUCOPHAGE) 500 MG tablet Take 1 tablet (500 mg total) by mouth daily with supper. 30 tablet 7  . Norethindrone Acetate-Ethinyl Estradiol (LOESTRIN) 1.5-30 MG-MCG tablet Take 1 tablet by mouth daily. 28 tablet 11   No current facility-administered medications for this visit.    Review of Systems Review of Systems  Constitutional: Positive for fatigue.  Respiratory: Negative.   Cardiovascular: Negative.   Gastrointestinal: Negative.   Genitourinary: Positive for menstrual problem, pelvic pain (cramps) and vaginal bleeding.    Blood pressure 120/71, pulse 70, height 5\' 3"  (1.6 m), weight 225 lb 14.4 oz (102.5 kg).  Physical Exam Physical Exam Constitutional:      Appearance: She is obese. She is not ill-appearing.  Abdominal:  General: There is no distension.  Skin:    General: Skin is warm and dry.  Neurological:     Mental Status: She is alert.  Psychiatric:        Mood and Affect: Mood normal.        Behavior: Behavior normal.     Data Reviewed CBC    Component Value Date/Time   WBC 8.6 12/17/2016 1348   RBC 3.95 12/17/2016 1348   HGB 9.3 (L) 12/17/2016 1348   HCT 31.1 (L) 12/17/2016 1348   PLT 361 12/17/2016 1348   MCV 78.7 12/17/2016 1348   MCH 23.5 (L) 12/17/2016 1348   MCHC 29.9 (L) 12/17/2016 1348   RDW 15.8 (H) 12/17/2016 1348     Assessment Abnormal uterine bleeding (AUB) - Plan: US PELVIC COMPLETE WITH TRANSVAGINAL, CBC, Norethindrone Acetate-Ethinyl Estradiol (LOESTRIN) 1.5-30 MG-MCG tablet  Chronic female pelvic pain  Obesity, Class III, BMI 40-49.9 (morbid obesity) (HCC) Likely anovulatory bleeding, but will check [place] for lesions   Plan Orders Placed This Encounter   Procedures  . US PELVIC COMPLETE WITH TRANSVAGINAL    Standing Status:   Future    Standing Expiration Date:   01/08/2021    Order Specific Question:   Reason for Exam (SYMPTOM  OR DIAGNOSIS REQUIRED)    Answer:   DUB    Order Specific Question:   Preferred imaging location?    Answer:   [facility] Ul[facility]ound    Order Specific Question:   Release to patient    Answer:   Immediate  . CBC    RTC 4 mo. Continuous OCP for 2 packs then as prescribed Scheryl Darter 01/09/2020, 10:53 AM

## 2020-01-10 MED ORDER — FERROUS SULFATE 325 (65 FE) MG PO TABS: 325.0000 mg | ORAL_TABLET | Freq: Every day | ORAL | 3 refills | Status: DC

## 2020-01-10 NOTE — Addendum Note (Signed)
Addended by: Adam Phenix on: 01/10/2020 12:03 PM   Modules accepted: Orders

## 2020-01-13 ENCOUNTER — Telehealth: Payer: Self-pay | Admitting: *Deleted

## 2020-01-13 NOTE — Telephone Encounter (Addendum)
-----   Message from Adam Phenix, MD sent at 01/10/2020 12:04 PM EDT ----- I ordered iron supplement for anemia  9/27  1021  Called pt w/[employer] interpreter [phone] and a message was left on her VM stating that she has low blood iron level. A prescription has been sent to her pharmacy for iron supplement. Dosage instructions were given.

## 2020-01-29 NOTE — Progress Notes (Signed)
Pt came to the office with c/o heavy vaginal bleeding.  With Spanish Interpreter Eda R., pt explains to me that she is taking OCP's and has been taking for two weeks now and she is continuing to have vaginal bleeding.  I informed pt that it does take at least a couple of pill packs before she sees any change.  Per chart review Dr. Debroah Loop recommended that the patient take OCP's continuous for two packs.  I advised pt that if her bleeding does not slow down to give the office a call.    Pt reports that she was to have an U/S scheduled.  U/S scheduled for October 26th @ 1400.  Pt verbalized understanding with no further questions.    Addison Naegeli, RN  01/29/20

## 2020-02-08 ENCOUNTER — Other Ambulatory Visit: Payer: Self-pay

## 2020-02-08 ENCOUNTER — Encounter (HOSPITAL_COMMUNITY): Payer: Self-pay | Admitting: Obstetrics & Gynecology

## 2020-02-08 ENCOUNTER — Inpatient Hospital Stay (HOSPITAL_COMMUNITY)
Admission: AD | Admit: 2020-02-08 | Discharge: 2020-02-08 | Disposition: A | Discharge status: Home / Self Care | Payer: Self-pay | Attending: Obstetrics & Gynecology | Admitting: Obstetrics & Gynecology

## 2020-02-08 DIAGNOSIS — N939 Abnormal uterine and vaginal bleeding, unspecified: Secondary | ICD-10-CM | POA: Insufficient documentation

## 2020-02-08 HISTORY — DX: Encounter for other specified aftercare: Z51.89

## 2020-02-08 HISTORY — DX: Type 2 diabetes mellitus without complications: E11.9

## 2020-02-08 LAB — CBC
HCT: 29.9 % — ABNORMAL LOW (ref 36.0–46.0)
Hemoglobin: 9.1 g/dL — ABNORMAL LOW (ref 12.0–15.0)
MCH: 29.7 pg (ref 26.0–34.0)
MCHC: 30.4 g/dL (ref 30.0–36.0)
MCV: 97.7 fL (ref 80.0–100.0)
Platelets: 343 10*3/uL (ref 150–400)
RBC: 3.06 MIL/uL — ABNORMAL LOW (ref 3.87–5.11)
RDW: 17.1 % — ABNORMAL HIGH (ref 11.5–15.5)
WBC: 8.2 10*3/uL (ref 4.0–10.5)
nRBC: 0 % (ref 0.0–0.2)

## 2020-02-08 LAB — I-STAT BETA HCG BLOOD, ED (MC, WL, AP ONLY): I-stat hCG, quantitative: <5 m[IU]/mL (ref <5)

## 2020-02-08 MED ORDER — KETOROLAC TROMETHAMINE 60 MG/2ML IM SOLN
60.0000 mg | Freq: Once | INTRAMUSCULAR | Status: AC
  Administered 2020-02-08: 60 mg via INTRAMUSCULAR
  Filled 2020-02-08: qty 2

## 2020-02-08 MED ORDER — MEGESTROL ACETATE 40 MG PO TABS: 40.0000 mg | ORAL_TABLET | Freq: Two times a day (BID) | ORAL | 0 refills | Status: AC

## 2020-02-08 NOTE — MAU Note (Signed)
Pt arrived with complaints of increased vaginal pain and pressure-feels "bloated"-with increased vaginal bleeding and "large clots-5 large clots and 15 saturated pads since last evening. Pt states she has an ultrasound scheduled for Monday but was concerned because of the increased pain and clots.  All interpreted with spanish hospital interpreter-Raquel

## 2020-02-08 NOTE — Discharge Instructions (Signed)
Sangrado uterino anormal Abnormal Uterine Bleeding El sangrado uterino anormal sucede cuando tiene un sangrado anormal del tero. Esto incluye lo siguiente:  Prdidas de sangre o hemorragias entre los perodos menstruales.  Sangrado luego de mantener relaciones sexuales.  Sangrado ms abundante que lo normal.  Perodos que duran ms de lo normal.  Sangrado luego de la menopausia. El sangrado uterino anormal puede afectar a las mujeres que estn en diversas etapas de la vida, desde adolescentes, mujeres frtiles y mujeres embarazadas, hasta mujeres que han llegado a la menopausia. Las causas ms comunes de sangrado uterino anormal incluyen lo siguiente:  Embarazo.  Crecimiento de tejido (plipos).  Tumores no cancerosos (fibromas) en el tero.  Infeccin.  Cncer.  Desequilibrios hormonales. El mdico debe evaluar cualquier clase de sangrado anormal. Muchos casos son leves y simples de tratar, mientras que otros son ms graves. El tratamiento depender de la causa del sangrado. Siga estas indicaciones en su casa:  Controle su afeccin para ver si hay cambios.  No se haga duchas vaginales, no utilice tampones ni tenga relaciones sexuales si as se lo indic su mdico.  Cambie los apsitos con frecuencia.  Realcese los exmenes de rutina que incluyen exmenes plvicos y controles de cncer de cuello uterino.  Concurra a todas las visitas de control como se lo haya indicado el mdico. Esto es importante. Comunquese con un mdico si:  El sangrado dura ms de una semana.  Se siente mareada por momentos.  Siente nuseas o vomita. Solicite ayuda de inmediato si:  Se desmaya.  Sangrado abundante que implica cambiar el apsito cada hora.  Siente dolor abdominal.  Tiene fiebre.  Se siente dbil o presenta sudoracin.  Elimina cogulos de sangre grandes por la vagina. Resumen  El sangrado uterino anormal sucede cuando tiene un sangrado anormal del tero.  El mdico  debe evaluar cualquier clase de sangrado anormal. Muchos casos son leves y simples de tratar, mientras que otros son ms graves.  El tratamiento depender de la causa del sangrado. Esta informacin no tiene como fin reemplazar el consejo del mdico. Asegrese de hacerle al mdico cualquier pregunta que tenga. Document Revised: 10/24/2016 Document Reviewed: 10/24/2016 Elsevier Patient Education  2020 Elsevier Inc.  

## 2020-02-08 NOTE — ED Triage Notes (Signed)
Pt reports vaginal bleeding for the past  three months and abdominal cramping pain. [facility]and reports they were going to give an ultrasound this coming Monday 10/25.

## 2020-02-08 NOTE — MAU Provider Note (Signed)
First Provider Initiated Contact with Patient 02/08/20 1940      S [name] is a 39 y.o. [id number] patient who presents to MAU today with complaint of vaginal bleeding. This has been an ongoing issue for >6 months. She is scheduled for a pelvic ultrasound on Monday. She was started on OCPs but states the bleeding is only getting heavier.    O BP (!) 116/52 (BP Location: Right Arm)   Pulse 70   Temp 98.7 F (37.1 C) (Oral)   Resp 17   Ht 5\' 3"  (1.6 m)   Wt 100.7 kg   SpO2 100%   BMI 39.33 kg/m  Physical Exam Vitals and nursing note reviewed.  Constitutional:      General: She is not in acute distress.    Appearance: She is well-developed.  HENT:     Head: Normocephalic.  Eyes:     Pupils: Pupils are equal, round, and reactive to light.  Cardiovascular:     Rate and Rhythm: Normal rate and regular rhythm.     Heart sounds: Normal heart sounds.  Pulmonary:     Effort: Pulmonary effort is normal. No respiratory distress.     Breath sounds: Normal breath sounds.  Abdominal:     General: Bowel sounds are normal. There is no distension.     Palpations: Abdomen is soft.     Tenderness: There is no abdominal tenderness.  Skin:    General: Skin is warm and dry.  Neurological:     Mental Status: She is alert and oriented to person, place, and time.  Psychiatric:        Behavior: Behavior normal.        Thought Content: Thought content normal.        Judgment: Judgment normal.    Results for orders placed or performed during the hospital encounter of 02/08/20 (from the past 24 hour(s))  I-Stat beta hCG blood, ED     Status: None   Collection Time: 02/08/20  4:26 PM  Result Value Ref Range   I-stat hCG, quantitative <5.0 <5 mIU/mL   Comment 3          CBC     Status: Abnormal   Collection Time: 02/08/20  5:43 PM  Result Value Ref Range   WBC 8.2 4.0 - 10.5 K/uL   RBC 3.06 (L) 3.87 - 5.11 MIL/uL   Hemoglobin 9.1 (L) 12.0 - 15.0 g/dL   HCT 02/10/20 (L) 36 -  46 %   MCV 97.7 80.0 - 100.0 fL   MCH 29.7 26.0 - 34.0 pg   MCHC 30.4 30.0 - 36.0 g/dL   RDW 90.2 (H) 40.9 - 73.5 %   Platelets 343 150 - 400 K/uL   nRBC 0.0 0.0 - 0.2 %   A Medical screening exam complete 1. Vaginal bleeding    P Discharge from MAU in stable condition Patient given the option of transfer to [facility] for further evaluation or seek care in outpatient facility of choice. Message sent to [facility] to schedule with Dr[facility] for follow up after ultrasound this week List of options for follow-up given Warning signs for worsening condition that would warrant emergency follow-up discussed Patient may return to MAU as needed   Debroah Loop, CNM 02/08/2020 8:02 PM

## 2020-02-08 NOTE — ED Triage Notes (Signed)
Emergency Medicine Provider OB Triage Evaluation Note  Nancy Romero is a 39 y.o. female, G2P2002, at Unknown gestation who presents to the emergency department with complaints of worsening vaginal bleeding and abdominal pain. This has been an ongoing issue for several months. She was seen by Dr. Debroah Loop last month and started on birth control. She has an ultrasound scheduled for next Monday. She said she came today because her bleeding and lower abdominal pain is getting worse. She is going through 15 pads a day and they are fully saturated. She is passing large clots.   Review of  Systems  Positive: vaginal bleeding, abdominal pain.  Negative: urinary complaints.   Physical Exam  BP 135/72 (BP Location: Right Arm)   Pulse 76   Temp 98.4 F (36.9 C) (Oral)   SpO2 93%  General: Awake, no distress  HEENT: Atraumatic  Resp: Normal effort  Cardiac: Normal rate Abd: Nondistended, mild tenderness noted to the lower abdomen diffusely no focal point.  MSK: Moves all extremities without difficulty Neuro: Speech clear  Medical Decision Making  Pt evaluated for pregnancy concern and is stable for transfer to [facility]. Pt is in agreement with plan for transfer.  Pregnancy test negative.   5:17 PM Discussed with [facility]U APP, [place], who accepts patient in transfer.  Clinical Impression   1. Vaginal bleeding        Maxwell Caul, PA-C 02/08/20 1717

## 2020-02-08 NOTE — MAU Note (Signed)
Pt denies feeling light headed or dizzy-Denies SOB or chest tightness, pressure or difficulty breathing

## 2020-02-11 ENCOUNTER — Other Ambulatory Visit: Payer: Self-pay

## 2020-02-11 ENCOUNTER — Ambulatory Visit
Admission: RE | Admit: 2020-02-11 | Discharge: 2020-02-11 | Disposition: A | Discharge status: Home / Self Care | Payer: Self-pay | Source: Ambulatory Visit | Attending: Obstetrics & Gynecology | Admitting: Obstetrics & Gynecology

## 2020-02-11 DIAGNOSIS — N939 Abnormal uterine and vaginal bleeding, unspecified: Secondary | ICD-10-CM

## 2020-03-03 ENCOUNTER — Other Ambulatory Visit: Payer: Self-pay

## 2020-03-03 ENCOUNTER — Encounter: Payer: Self-pay | Admitting: Obstetrics & Gynecology

## 2020-03-03 ENCOUNTER — Ambulatory Visit (INDEPENDENT_AMBULATORY_CARE_PROVIDER_SITE_OTHER): Payer: Self-pay | Admitting: Obstetrics & Gynecology

## 2020-03-03 VITALS — BP 110/61 | HR 79 | Wt 222.1 lb

## 2020-03-03 DIAGNOSIS — N939 Abnormal uterine and vaginal bleeding, unspecified: Secondary | ICD-10-CM

## 2020-03-03 DIAGNOSIS — D500 Iron deficiency anemia secondary to blood loss (chronic): Secondary | ICD-10-CM

## 2020-03-03 MED ORDER — BLISOVI FE 1.5/30 1.5-30 MG-MCG PO TABS: 1.0000 | ORAL_TABLET | Freq: Every day | ORAL | 5 refills | Status: DC

## 2020-03-03 NOTE — Progress Notes (Signed)
Patient ID: Nancy Romero, female   DOB: 08-04-1980, 39 y.o.   MRN: [id number]  Chief Complaint  Patient presents with  . Follow-up    HPI Nancy Romero is a 39 y.o. female.  [id number] Patient's last menstrual period was 02/29/2020 (exact date). She started OCP and took continuously. She currently is on her second pack, with some BTB but not heavy and less pain. She is willing to continue OCP HPI  Past Medical History:  Diagnosis Date  . Anemia    recieved IV iron 1 year ago   . Blood transfusion without reported diagnosis    1 year ago  . Diabetes mellitus without complication (HCC)    Type 2 -x 3 years pre diabetic-diagnosed and taking medication x 6 months    Past Surgical History:  Procedure Laterality Date  . CESAREAN SECTION      x 2    Family History  Problem Relation Age of Onset  . Alcohol abuse Neg Hx   . Arthritis Neg Hx   . Asthma Neg Hx   . Birth defects Neg Hx   . Cancer Neg Hx   . COPD Neg Hx   . Depression Neg Hx   . Diabetes Neg Hx   . Drug abuse Neg Hx   . Early death Neg Hx   . Hearing loss Neg Hx   . Heart disease Neg Hx   . Hyperlipidemia Neg Hx   . Hypertension Neg Hx   . Kidney disease Neg Hx   . Learning disabilities Neg Hx   . Mental illness Neg Hx   . Mental retardation Neg Hx   . Miscarriages / Stillbirths Neg Hx   . Stroke Neg Hx   . Vision loss Neg Hx   . Varicose Veins Neg Hx     Social History Social History   Tobacco Use  . Smoking status: Never Smoker  . Smokeless tobacco: Never Used  Vaping Use  . Vaping Use: Never used  Substance Use Topics  . Alcohol use: No  . Drug use: No    No Known Allergies  Current Outpatient Medications  Medication Sig Dispense Refill  . ferrous sulfate 325 (65 FE) MG tablet Take 1 tablet (325 mg total) by mouth daily with breakfast. 30 tablet 3  . lisinopril (ZESTRIL) 2.5 MG tablet Take 2.5 mg by mouth daily.    . metFORMIN (GLUCOPHAGE) 1000 MG tablet Take 1,000  mg by mouth daily with breakfast.     . metFORMIN (GLUCOPHAGE) 500 MG tablet Take 1 tablet (500 mg total) by mouth daily with supper. 30 tablet 7  . Multiple Vitamin (MULTIVITAMIN) tablet Take 1 tablet by mouth daily.    Marland Kitchen BLISOVI FE 1.5/30 1.5-30 MG-MCG tablet Take 1 tablet by mouth daily. 28 tablet 5   No current facility-administered medications for this visit.    Review of Systems Review of Systems  Genitourinary: Positive for vaginal bleeding. Negative for pelvic pain.    Blood pressure 110/61, pulse 79, weight 222 lb 1.6 oz (100.7 kg), last menstrual period 02/29/2020.  Physical Exam Physical Exam Constitutional:      Appearance: She is obese. She is not ill-appearing.  Pulmonary:     Effort: Pulmonary effort is normal.  Neurological:     Mental Status: She is alert.  Psychiatric:        Mood and Affect: Mood normal.        Behavior: Behavior normal.     Data Reviewed CLINICAL  DATA:  Dysfunctional uterine bleeding, abnormal uterine bleeding for 3 months, history of Caesarean section  EXAM: TRANSABDOMINAL AND TRANSVAGINAL ULTRASOUND OF PELVIS  TECHNIQUE: Both transabdominal and transvaginal ultrasound examinations of the pelvis were performed. Transabdominal technique was performed for global imaging of the pelvis including uterus, ovaries, adnexal regions, and pelvic cul-de-sac. It was necessary to proceed with endovaginal exam following the transabdominal exam to visualize the endometrium and ovaries.  COMPARISON:  11/19/2014  FINDINGS: Uterus  Measurements: 10.7 x 6.6 x 8.2 cm = volume: 302 mL. Anteverted. Heterogeneous myometrium. Slight asymmetric thickening of the posterior wall. Scattered areas of shadowing. Findings raise question of adenomyosis. No focal mass.  Endometrium  Thickness: 17 mm. Ill-defined, heterogeneous. No endometrial fluid.  Right ovary  Measurements: 2.9 x 2.1 x 2.0 cm = volume: 6.3 mL. Normal morphology without  mass  Left ovary  Measurements: 2.9 x 2.2 x 1.7 cm = volume: 5.4 mL. Normal morphology without mass  Other findings  No endometrial fluid or adnexal mass.  IMPRESSION: Unremarkable ovaries.  Heterogeneous asymmetrically thickened myometrium question adenomyosis.  Thickened ill-defined endometrial complex 17 mm thick; if bleeding remains unresponsive to hormonal or medical therapy, focal lesion work-up with sonohysterogram should be considered. Endometrial biopsy should also be considered in pre-menopausal patients at high risk for endometrial carcinoma. (Ref: Radiological Reasoning: Algorithmic Workup of Abnormal Vaginal Bleeding with Endovaginal Sonography and Sonohysterography. AJR 2008; 341:D62-22)   Electronically Signed   By: Ulyses Southward M.D.   On: 02/11/2020 16:13  CBC    Component Value Date/Time   WBC 8.2 02/08/2020 1743   RBC 3.06 (L) 02/08/2020 1743   HGB 9.1 (L) 02/08/2020 1743   HGB 8.4 (L) 01/09/2020 1101   HCT 29.9 (L) 02/08/2020 1743   HCT 26.5 (L) 01/09/2020 1101   PLT 343 02/08/2020 1743   PLT 323 01/09/2020 1101   MCV 97.7 02/08/2020 1743   MCV 88 01/09/2020 1101   MCH 29.7 02/08/2020 1743   MCHC 30.4 02/08/2020 1743   RDW 17.1 (H) 02/08/2020 1743   RDW 16.5 (H) 01/09/2020 1101    Assessment Abnormal uterine bleeding (AUB) - Plan: BLISOVI FE 1.5/30 1.5-30 MG-MCG tablet  Iron deficiency anemia due to chronic blood loss  Adenomyosis suspected on [place]  Plan Continue FE OCP for cycle control RTC to review in 3 months and may continue OCP or discuss alternative if desired    Scheryl Darter 03/03/2020, 11:42 AM

## 2020-11-26 ENCOUNTER — Ambulatory Visit (INDEPENDENT_AMBULATORY_CARE_PROVIDER_SITE_OTHER): Payer: Self-pay | Admitting: Obstetrics & Gynecology

## 2020-11-26 ENCOUNTER — Encounter: Payer: Self-pay | Admitting: Obstetrics & Gynecology

## 2020-11-26 ENCOUNTER — Other Ambulatory Visit: Payer: Self-pay

## 2020-11-26 VITALS — BP 121/74 | HR 88 | Wt 227.0 lb

## 2020-11-26 DIAGNOSIS — R1030 Lower abdominal pain, unspecified: Secondary | ICD-10-CM

## 2020-11-26 DIAGNOSIS — Z603 Acculturation difficulty: Secondary | ICD-10-CM

## 2020-11-26 DIAGNOSIS — E66813 Obesity, class 3: Secondary | ICD-10-CM

## 2020-11-26 DIAGNOSIS — E1169 Type 2 diabetes mellitus with other specified complication: Secondary | ICD-10-CM

## 2020-11-26 DIAGNOSIS — N939 Abnormal uterine and vaginal bleeding, unspecified: Secondary | ICD-10-CM

## 2020-11-26 DIAGNOSIS — Z789 Other specified health status: Secondary | ICD-10-CM

## 2020-11-26 LAB — CBC
Hematocrit: 30.3 % — ABNORMAL LOW (ref 34.0–46.6)
Hemoglobin: 9.6 g/dL — ABNORMAL LOW (ref 11.1–15.9)
MCH: 28.5 pg (ref 26.6–33.0)
MCHC: 31.7 g/dL (ref 31.5–35.7)
MCV: 90 fL (ref 79–97)
Platelets: 348 10*3/uL (ref 150–450)
RBC: 3.37 x10E6/uL — ABNORMAL LOW (ref 3.77–5.28)
RDW: 13.6 % (ref 11.7–15.4)
WBC: 10 10*3/uL (ref 3.4–10.8)

## 2020-11-26 MED ORDER — IBUPROFEN 800 MG PO TABS: 800.0000 mg | ORAL_TABLET | Freq: Three times a day (TID) | ORAL | 1 refills | Status: DC | PRN

## 2020-11-26 MED ORDER — FERROUS SULFATE 325 (65 FE) MG PO TABS: 325.0000 mg | ORAL_TABLET | ORAL | 3 refills | Status: DC

## 2020-11-26 MED ORDER — BLISOVI FE 1.5/30 1.5-30 MG-MCG PO TABS: ORAL_TABLET | ORAL | 6 refills | Status: DC

## 2020-11-26 NOTE — Progress Notes (Addendum)
History:  [name] is a 40 y.o. [id number] who presents to clinic today for abnormal bleeding. She has been experiencing this bleeding for several months and is currently taking COCPs and medroxyprogesterone. When she first started the birth control, the bleeding was well controlled. However, over the past 1.5 months, the bleeding has been constant. She also endorses associated lower abdominal cramping and has taken 1 tablet of ibuprofen every 6-8 hours without relief. She has an US performed last year which revealed possible adenomyosis. She has never had an endometrial biopsy.   She had her blood levels checked by her PCP who instructed her to stop her PO iron supplement.   The following portions of the patient's history were reviewed and updated as appropriate: allergies, current medications, family history, past medical history, social history, past surgical history and problem list.  Review of Systems:  Review of Systems  Constitutional:  Negative for fever.  Gastrointestinal:  Positive for abdominal pain (lower abdominal pain). Negative for diarrhea and vomiting.  Genitourinary:        Persistent vaginal bleeding   Neurological:  Positive for dizziness (unsteadiness with walking).     Objective:  Physical Exam BP 121/74   Pulse 88   Wt 227 lb (103 kg)   BMI 40.21 kg/m  Physical Exam Exam conducted with a chaperone present.  Constitutional:      Appearance: Normal appearance.  HENT:     Head: Normocephalic and atraumatic.  Cardiovascular:     Rate and Rhythm: Normal rate.  Pulmonary:     Effort: Pulmonary effort is normal.  Genitourinary:    General: Normal vulva.     Exam position: Lithotomy position.     Cervix: Dilated.     Uterus: Normal.      Adnexa: Right adnexa normal and left adnexa normal.     Comments: Blood and clotting noted in vaginal canal. 3 Fox swabs were utilized to clear the blood. More blood continued to come.  Skin:    General:  Skin is warm and dry.  Neurological:     Mental Status: She is alert and oriented to person, place, and time.  Psychiatric:        Mood and Affect: Mood normal.        Behavior: Behavior normal.      Labs and Imaging No results found for this or any previous visit (from the past 24 hour(s)).  No results found.  Health Maintenance Due  Topic Date Due   PNEUMOCOCCAL POLYSACCHARIDE VACCINE AGE 64-64 HIGH RISK  Never done   FOOT EXAM  Never done   OPHTHALMOLOGY EXAM  Never done   Hepatitis C Screening  Never done   TETANUS/TDAP  Never done   HEMOGLOBIN A1C  05/02/2014   COVID-19 Vaccine (3 - Booster for Pfizer series) 02/02/2020   PAP SMEAR-Modifier  06/12/2020   INFLUENZA VACCINE  11/16/2020     Assessment & Plan:  1. Abnormal uterine bleeding (AUB) - Increased the COCP to 2 tablets of Blisovi daily, discontinued the medoxyprogesterone  - Patient provided with paperwork for financial assistance due to lack of insurance so she can be scheduled for an endometrial biopsy to further evaluate her bleeding  - Instructed patient to restart PO iron supplement every other day  - CBC ordered   2. Type 2 diabetes mellitus with other specified complication, unspecified whether long term insulin use (HCC)  3. Obesity, Class III, BMI 40-49.9 (morbid obesity) (HCC)  4. Language barrier,  speaks Spanish only - In person interpretor utilized in this visit   5. Lower abdominal pain  - Instructed patient to take 800 mg of ibuprofen every 8 hours for pain relief   Seen with PA student Athena Masse, exam performed by me and patient counseled  Adam Phenix, MD 11/26/2020

## 2020-12-08 ENCOUNTER — Telehealth: Payer: Self-pay | Admitting: General Practice

## 2020-12-08 NOTE — Telephone Encounter (Signed)
Called patient with Nancy Romero for spanish interpreter regarding OCP formulation not available at pharmacy per fax report. Patient states pharmacy was able to get the OCP in stock finally and she picked the Rx up yesterday. Patient had no questions.

## 2021-01-11 ENCOUNTER — Other Ambulatory Visit: Payer: Self-pay

## 2021-01-11 ENCOUNTER — Ambulatory Visit (INDEPENDENT_AMBULATORY_CARE_PROVIDER_SITE_OTHER): Payer: Self-pay | Admitting: Obstetrics and Gynecology

## 2021-01-11 ENCOUNTER — Encounter: Payer: Self-pay | Admitting: Obstetrics and Gynecology

## 2021-01-11 VITALS — BP 123/78 | HR 67 | Wt 225.1 lb

## 2021-01-11 DIAGNOSIS — N939 Abnormal uterine and vaginal bleeding, unspecified: Secondary | ICD-10-CM

## 2021-01-11 MED ORDER — BLISOVI FE 1.5/30 1.5-30 MG-MCG PO TABS: ORAL_TABLET | ORAL | 6 refills | Status: DC

## 2021-01-11 NOTE — Patient Instructions (Signed)
Mantenimiento de la salud en las mujeres Health Maintenance, Female Adoptar un estilo de vida saludable y recibir atencin preventiva son importantes para promover la salud y el bienestar. Consulte al mdico sobre: El esquema adecuado para hacerse pruebas y exmenes peridicos. Cosas que puede hacer por su cuenta para prevenir enfermedades y mantenerse sana. Qu debo saber sobre la dieta, el peso y el ejercicio? Consuma una dieta saludable  Consuma una dieta que incluya muchas verduras, frutas, productos lcteos con bajo contenido de grasa y protenas magras. No consuma muchos alimentos ricos en grasas slidas, azcares agregados o sodio.  Mantenga un peso saludable El ndice de masa muscular (IMC) se utiliza para identificar problemas de peso. Proporciona una estimacin de la grasa corporal basndose en el peso y la altura. Su mdico puede ayudarle a determinar su IMC y a lograr o mantener unpeso saludable. Haga ejercicio con regularidad Haga ejercicio con regularidad. Esta es una de las prcticas ms importantes que puede hacer por su salud. La mayora de los adultos deben seguir estas pautas: Realizar, al menos, 150minutos de actividad fsica por semana. El ejercicio debe aumentar la frecuencia cardaca y hacerlo transpirar (ejercicio de intensidad moderada). Hacer ejercicios de fortalecimiento por lo menos dos veces por semana. Agregue esto a su plan de ejercicio de intensidad moderada. Pasar menos tiempo sentados. Incluso la actividad fsica ligera puede ser beneficiosa. Controle sus niveles de colesterol y lpidos en la sangre Comience a realizarse anlisis de lpidos y colesterol en la sangre a los20aos y luego reptalos cada 5aos. Hgase controlar los niveles de colesterol con mayor frecuencia si: Sus niveles de lpidos y colesterol son altos. Es mayor de 40aos. Presenta un alto riesgo de padecer enfermedades cardacas. Qu debo saber sobre las pruebas de deteccin del  cncer? Segn su historia clnica y sus antecedentes familiares, es posible que deba realizarse pruebas de deteccin del cncer en diferentes edades. Esto puede incluir pruebas de deteccin de lo siguiente: Cncer de mama. Cncer de cuello uterino. Cncer colorrectal. Cncer de piel. Cncer de pulmn. Qu debo saber sobre la enfermedad cardaca, la diabetes y la hipertensinarterial? Presin arterial y enfermedad cardaca La hipertensin arterial causa enfermedades cardacas y aumenta el riesgo de accidente cerebrovascular. Es ms probable que esto se manifieste en las personas que tienen lecturas de presin arterial alta, tienen ascendencia africana o tienen sobrepeso. Hgase controlar la presin arterial: Cada 3 a 5 aos si tiene entre 18 y 39 aos. Todos los aos si es mayor de 40aos. Diabetes Realcese exmenes de deteccin de la diabetes con regularidad. Este anlisis revisa el nivel de azcar en la sangre en ayunas. Hgase las pruebas de deteccin: Cada tresaos despus de los 40aos de edad si tiene un peso normal y un bajo riesgo de padecer diabetes. Con ms frecuencia y a partir de una edad inferior si tiene sobrepeso o un alto riesgo de padecer diabetes. Qu debo saber sobre la prevencin de infecciones? Hepatitis B Si tiene un riesgo ms alto de contraer hepatitis B, debe someterse a un examen de deteccin de este virus. Hable con el mdico para averiguar si tiene riesgode contraer la infeccin por hepatitis B. Hepatitis C Se recomienda el anlisis a: Todos los que nacieron entre 1945 y 1965. Todas las personas que tengan un riesgo de haber contrado hepatitis C. Enfermedades de transmisin sexual (ETS) Hgase las pruebas de deteccin de ITS, incluidas la gonorrea y la clamidia, si: Es sexualmente activa y es menor de 24aos. Es mayor de 24aos, y el mdico   le informa que corre riesgo de tener este tipo de infecciones. La actividad sexual ha cambiado desde que le  hicieron la ltima prueba de deteccin y tiene un riesgo mayor de tener clamidia o gonorrea. Pregntele al mdico si usted tiene riesgo. Pregntele al mdico si usted tiene un alto riesgo de contraer VIH. El mdico tambin puede recomendarle un medicamento recetado para ayudar a evitar la infeccin por el VIH. Si elige tomar medicamentos para prevenir el VIH, primero debe hacerse los anlisis de deteccin del VIH. Luego debe hacerse anlisis cada 3meses mientras est tomando los medicamentos. Embarazo Si est por dejar de menstruar (fase premenopusica) y usted puede quedar embarazada, busque asesoramiento antes de quedar embarazada. Tome de 400 a 800microgramos (mcg) de cido flico todos los das si queda embarazada. Pida mtodos de control de la natalidad (anticonceptivos) si desea evitar un embarazo no deseado. Osteoporosis y menopausia La osteoporosis es una enfermedad en la que los huesos pierden los minerales y la fuerza por el avance de la edad. El resultado pueden ser fracturas en los huesos. Si tiene 65aos o ms, o si est en riesgo de sufrir osteoporosis y fracturas, pregunte a su mdico si debe: Hacerse pruebas de deteccin de prdida sea. Tomar un suplemento de calcio o de vitamina D para reducir el riesgo de fracturas. Recibir terapia de reemplazo hormonal (TRH) para tratar los sntomas de la menopausia. Siga estas instrucciones en su casa: Estilo de vida No consuma ningn producto que contenga nicotina o tabaco, como cigarrillos, cigarrillos electrnicos y tabaco de mascar. Si necesita ayuda para dejar de fumar, consulte al mdico. No consuma drogas. No comparta agujas. Solicite ayuda a su mdico si necesita apoyo o informacin para abandonar las drogas. Consumo de alcohol No beba alcohol si: Su mdico le indica no hacerlo. Est embarazada, puede estar embarazada o est tratando de quedar embarazada. Si bebe alcohol: Limite la cantidad que consume de 0 a 1 medida por  da. Limite la ingesta si est amamantando. Est atento a la cantidad de alcohol que hay en las bebidas que toma. En [place], una medida equivale a una botella de cerveza de 12oz (355ml), un vaso de vino de 5oz (148ml) o un vaso de una bebida alcohlica de alta graduacin de 1oz (44ml). Instrucciones generales Realcese los estudios de rutina de la salud, dentales y de la vista. Mantngase al da con las vacunas. Infrmele a su mdico si: Se siente deprimida con frecuencia. Alguna vez ha sido vctima de maltrato o no se siente segura en su casa. Resumen Adoptar un estilo de vida saludable y recibir atencin preventiva son importantes para promover la salud y el bienestar. Siga las instrucciones del mdico acerca de una dieta saludable, el ejercicio y la realizacin de pruebas o exmenes para detectar enfermedades. Siga las instrucciones del mdico con respecto al control del colesterol y la presin arterial. Esta informacin no tiene como fin reemplazar el consejo del mdico. Asegresede hacerle al mdico cualquier pregunta que tenga. Document Revised: 04/25/2018 Document Reviewed: 04/25/2018 Elsevier Patient Education  2022 Elsevier Inc.  

## 2021-01-11 NOTE — Progress Notes (Signed)
Ms Toda presents for f/u of her AUB. See prior notes. Bleeding has stopped since last visit. She is now taking OCP's daily.  Needs pap smear  PE AF VSS Lungs clear Heart RRR Abd soft + BS  A/P AUB        Needs pap smear Will refer to [facility] for pap smear. Will continue with qd OCP's. Follow cycle. Reevaluate in 6 months

## 2021-01-29 ENCOUNTER — Telehealth: Payer: Self-pay

## 2021-01-29 NOTE — Telephone Encounter (Signed)
Call placed back to pt. Pt asking about Financial Application and getting PAP appt with [facility]. Also asking about refill of OCP. Pt advised has refills at [facility] for Rx. Pt verbalized understanding.    Pt advised [facility] will be calling pt with appt soon. [facility] notified. Pt verbalized understanding.   Pt advised will check on financial application and Raquel will return call to pt on Monday 10/17 with update. Pt verbalized understanding and agreeable to plan of care.   Laney Pastor

## 2021-01-29 NOTE — Telephone Encounter (Signed)
Pt requests call back regarding results and medications.

## 2021-05-05 ENCOUNTER — Ambulatory Visit: Payer: Self-pay

## 2021-12-03 IMAGING — US US PELVIS COMPLETE WITH TRANSVAGINAL
1 series · 15 of 25 positions shown · non-contrast
Comparison: 11/19/2014

CLINICAL DATA: Dysfunctional uterine bleeding, abnormal uterine
bleeding for 3 months, history of Caesarean section

EXAM:
TRANSABDOMINAL AND TRANSVAGINAL ULTRASOUND OF PELVIS
TECHNIQUE: Both transabdominal and transvaginal ultrasound examinations of the
pelvis were performed. Transabdominal technique was performed for
global imaging of the pelvis including uterus, ovaries, adnexal
regions, and pelvic cul-de-sac. It was necessary to proceed with
endovaginal exam following the transabdominal exam to visualize the
endometrium and ovaries.

[Series 1: us pelvis complete with transvaginal · 15 of 134 slices shown]
[im 1/134]
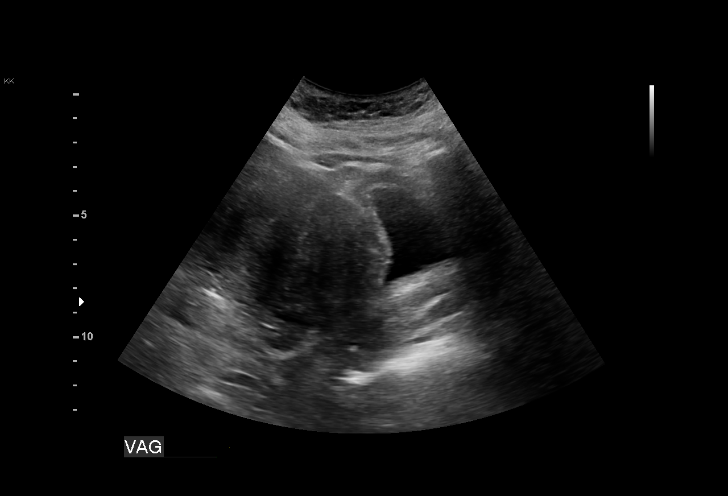
[im 12/134]
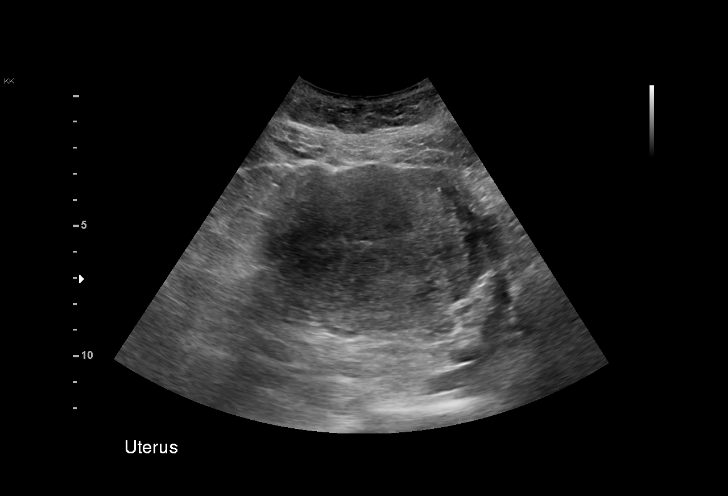
[im 23/134]
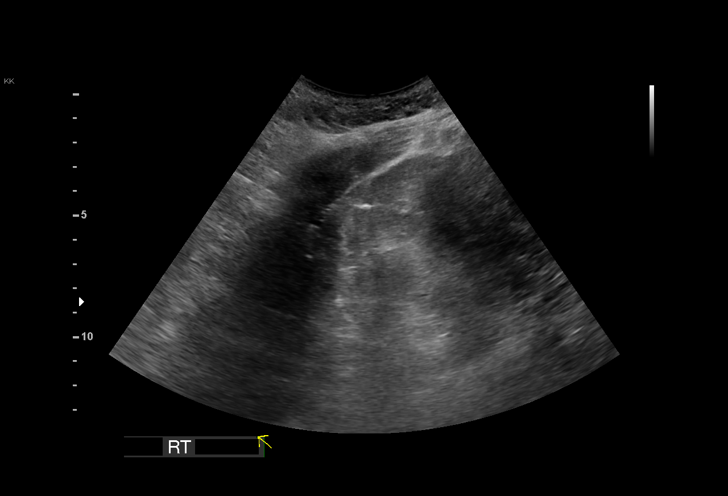
[im 28/134]
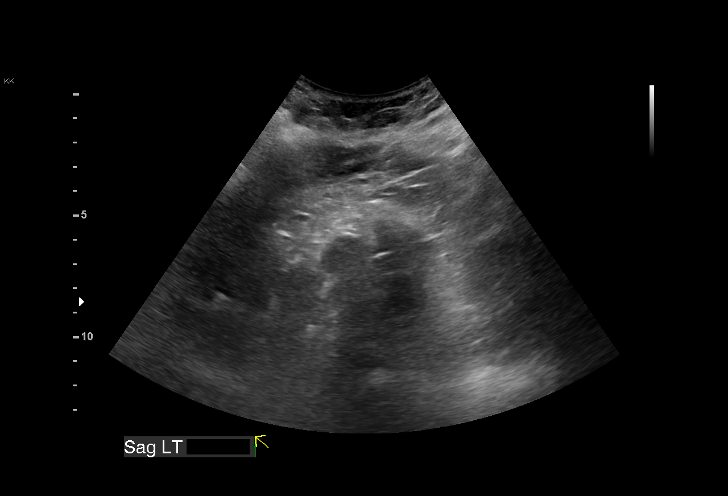
[im 39/134]
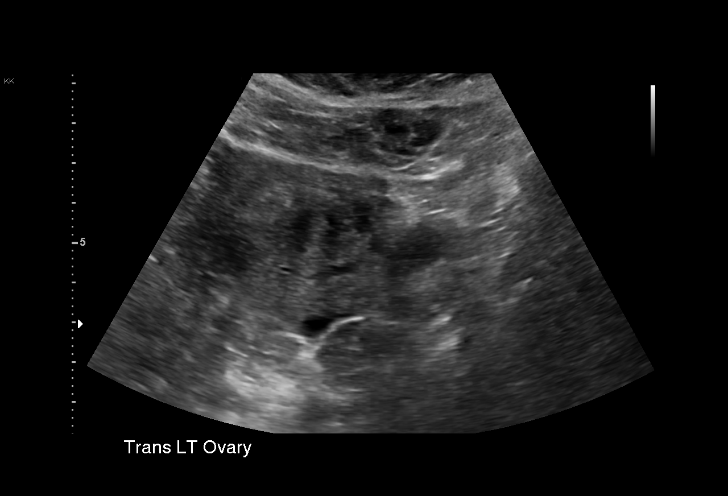
[im 50/134]
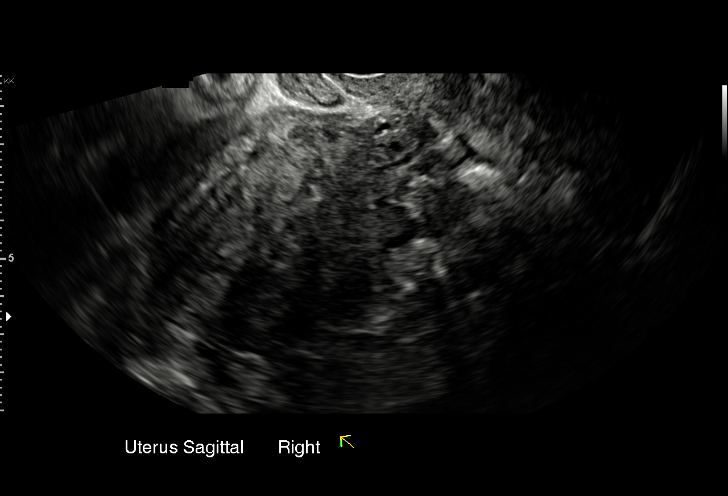
[im 56/134]
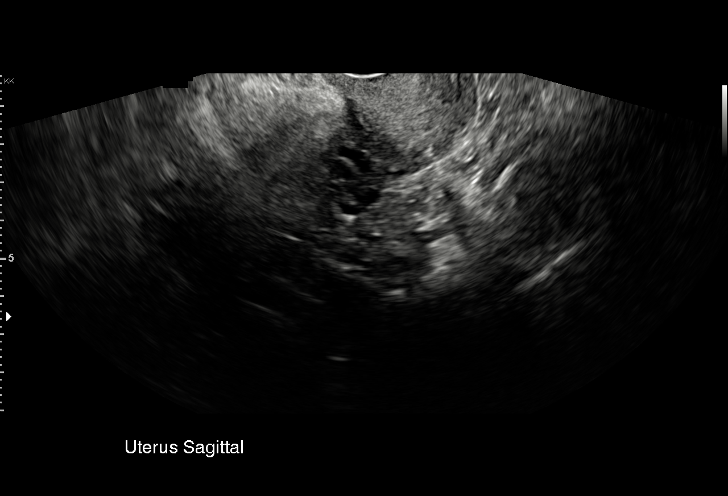
[im 67/134]
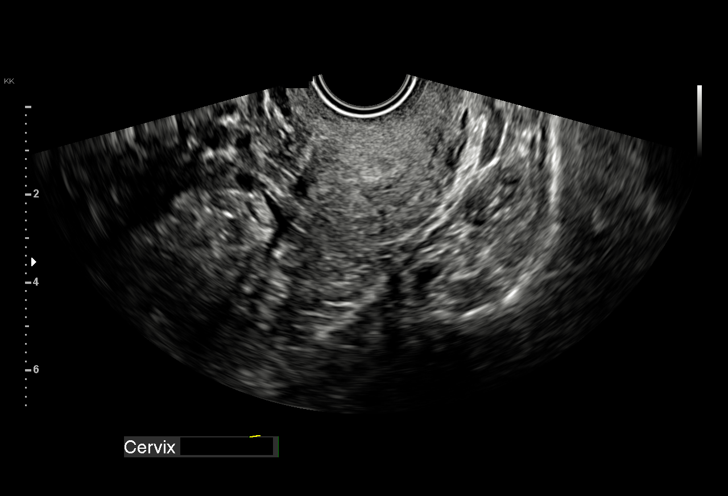
[im 78/134]
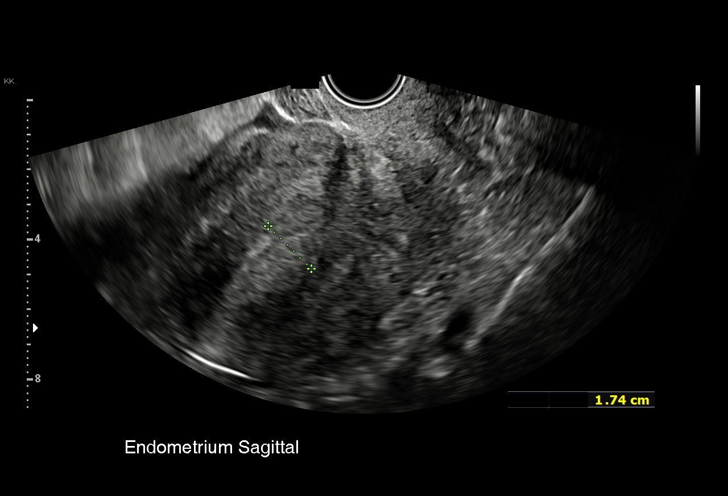
[im 84/134]
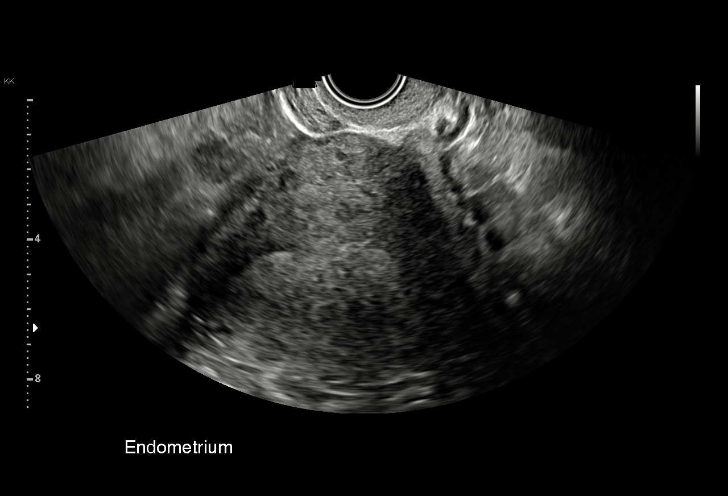
[im 95/134]
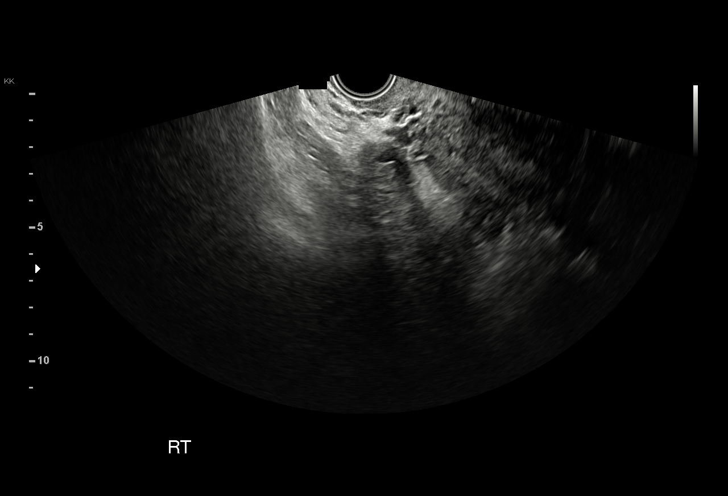
[im 106/134]
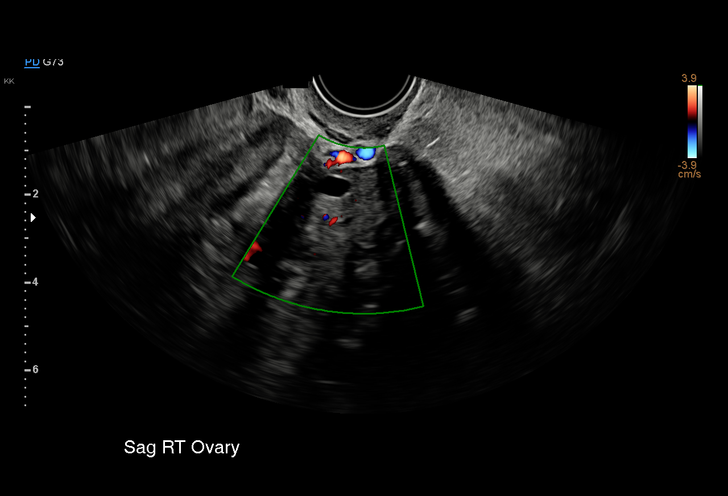
[im 111/134]
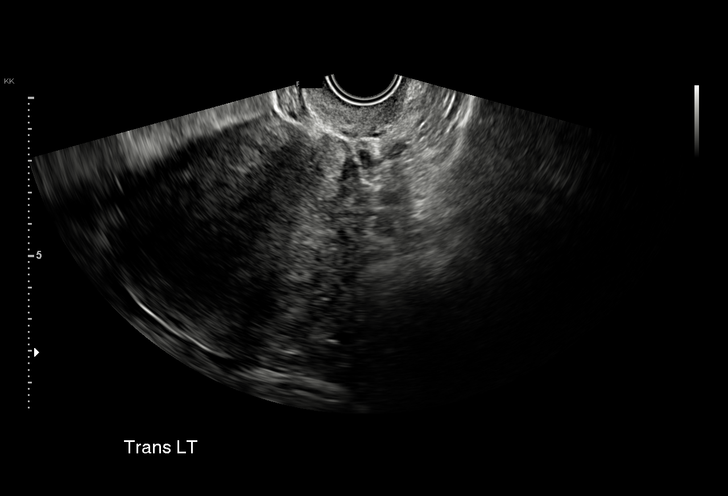
[im 122/134]
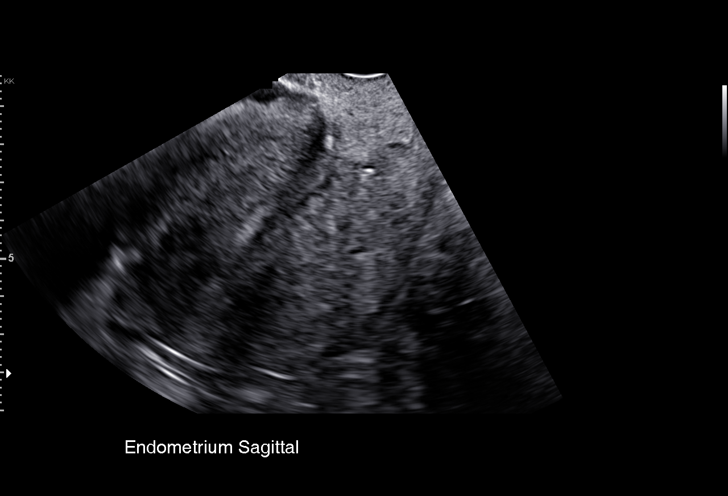
[im 134/134]
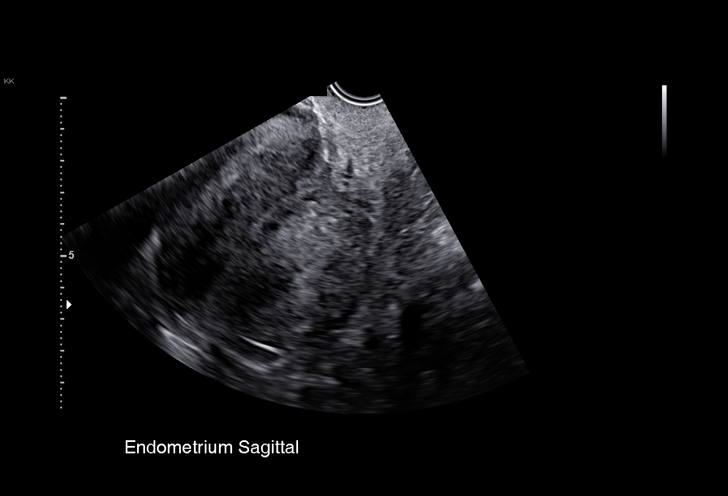

[15 of 25 positions shown; findings below may reference images not displayed]

FINDINGS: Uterus

Measurements: 10.7 x 6.6 x 8.2 cm = volume: 302 mL. Anteverted.
Heterogeneous myometrium. Slight asymmetric thickening of the
posterior wall. Scattered areas of shadowing. Findings raise
question of adenomyosis. No focal mass.

Endometrium

Thickness: 17 mm. Ill-defined, heterogeneous. No endometrial fluid.

Right ovary

Measurements: 2.9 x 2.1 x 2.0 cm = volume: 6.3 mL. Normal morphology
without mass

Left ovary

Measurements: 2.9 x 2.2 x 1.7 cm = volume: 5.4 mL. Normal morphology
without mass

Other findings

No endometrial fluid or adnexal mass.
IMPRESSION: Unremarkable ovaries.

Heterogeneous asymmetrically thickened myometrium question
adenomyosis.

Thickened ill-defined endometrial complex 17 mm thick; if bleeding
remains unresponsive to hormonal or medical therapy, focal lesion
work-up with sonohysterogram should be considered. Endometrial
biopsy should also be considered in pre-menopausal patients at high
risk for endometrial carcinoma. (Ref: Radiological Reasoning:
Algorithmic Workup of Abnormal Vaginal Bleeding with Endovaginal
Sonography and Sonohysterography. AJR 6226; 191:S68-73)

## 2021-12-28 ENCOUNTER — Emergency Department (HOSPITAL_COMMUNITY): Payer: 59

## 2021-12-28 ENCOUNTER — Other Ambulatory Visit: Payer: Self-pay

## 2021-12-28 ENCOUNTER — Emergency Department (HOSPITAL_COMMUNITY): Payer: 59 | Admitting: Certified Registered"

## 2021-12-28 ENCOUNTER — Emergency Department (HOSPITAL_BASED_OUTPATIENT_CLINIC_OR_DEPARTMENT_OTHER): Payer: 59 | Admitting: Certified Registered"

## 2021-12-28 ENCOUNTER — Encounter (HOSPITAL_COMMUNITY)
Admission: EM | Disposition: A | Discharge status: Home / Self Care | Payer: Self-pay | Source: Home / Self Care | Attending: Emergency Medicine

## 2021-12-28 ENCOUNTER — Ambulatory Visit (HOSPITAL_COMMUNITY)
Admission: EM | Admit: 2021-12-28 | Discharge: 2021-12-28 | Disposition: A | Discharge status: Home / Self Care | Payer: 59 | Attending: Emergency Medicine | Admitting: Emergency Medicine

## 2021-12-28 ENCOUNTER — Encounter (HOSPITAL_COMMUNITY): Payer: Self-pay | Admitting: Emergency Medicine

## 2021-12-28 DIAGNOSIS — Z7984 Long term (current) use of oral hypoglycemic drugs: Secondary | ICD-10-CM | POA: Diagnosis not present

## 2021-12-28 DIAGNOSIS — E119 Type 2 diabetes mellitus without complications: Secondary | ICD-10-CM | POA: Diagnosis not present

## 2021-12-28 DIAGNOSIS — I10 Essential (primary) hypertension: Secondary | ICD-10-CM | POA: Diagnosis not present

## 2021-12-28 DIAGNOSIS — K3580 Unspecified acute appendicitis: Secondary | ICD-10-CM | POA: Insufficient documentation

## 2021-12-28 DIAGNOSIS — Z79899 Other long term (current) drug therapy: Secondary | ICD-10-CM | POA: Diagnosis not present

## 2021-12-28 DIAGNOSIS — R1110 Vomiting, unspecified: Secondary | ICD-10-CM | POA: Diagnosis not present

## 2021-12-28 DIAGNOSIS — K760 Fatty (change of) liver, not elsewhere classified: Secondary | ICD-10-CM | POA: Diagnosis not present

## 2021-12-28 DIAGNOSIS — R109 Unspecified abdominal pain: Secondary | ICD-10-CM | POA: Diagnosis not present

## 2021-12-28 DIAGNOSIS — K353 Acute appendicitis with localized peritonitis, without perforation or gangrene: Secondary | ICD-10-CM | POA: Diagnosis not present

## 2021-12-28 DIAGNOSIS — R1011 Right upper quadrant pain: Secondary | ICD-10-CM | POA: Diagnosis not present

## 2021-12-28 HISTORY — PX: LAPAROSCOPIC APPENDECTOMY: SHX408

## 2021-12-28 LAB — CBC
HCT: 44.8 % (ref 36.0–46.0)
Hemoglobin: 15.1 g/dL — ABNORMAL HIGH (ref 12.0–15.0)
MCH: 30.5 pg (ref 26.0–34.0)
MCHC: 33.7 g/dL (ref 30.0–36.0)
MCV: 90.5 fL (ref 80.0–100.0)
Platelets: 233 10*3/uL (ref 150–400)
RBC: 4.95 MIL/uL (ref 3.87–5.11)
RDW: 13.3 % (ref 11.5–15.5)
WBC: 19 10*3/uL — ABNORMAL HIGH (ref 4.0–10.5)
nRBC: 0 % (ref 0.0–0.2)

## 2021-12-28 LAB — CBG MONITORING, ED: Glucose-Capillary: 340 mg/dL — ABNORMAL HIGH (ref 70–99)

## 2021-12-28 LAB — URINALYSIS, ROUTINE W REFLEX MICROSCOPIC
Bilirubin Urine: NEGATIVE
Glucose, UA: >=500 mg/dL — AB
Hgb urine dipstick: NEGATIVE
Ketones, ur: 80 mg/dL — AB
Leukocytes,Ua: NEGATIVE
Nitrite: NEGATIVE
Protein, ur: NEGATIVE mg/dL
Specific Gravity, Urine: 1.038 — ABNORMAL HIGH (ref 1.005–1.030)
pH: 6 (ref 5.0–8.0)

## 2021-12-28 LAB — I-STAT BETA HCG BLOOD, ED (MC, WL, AP ONLY): I-stat hCG, quantitative: <5 m[IU]/mL (ref <5)

## 2021-12-28 LAB — GLUCOSE, CAPILLARY
Glucose-Capillary: 270 mg/dL — ABNORMAL HIGH (ref 70–99)
Glucose-Capillary: 225 mg/dL — ABNORMAL HIGH (ref 70–99)

## 2021-12-28 LAB — COMPREHENSIVE METABOLIC PANEL
ALT: 80 U/L — ABNORMAL HIGH (ref 0–44)
AST: 60 U/L — ABNORMAL HIGH (ref 15–41)
Albumin: 3.7 g/dL (ref 3.5–5.0)
Alkaline Phosphatase: 99 U/L (ref 38–126)
Anion gap: 11 (ref 5–15)
BUN: 6 mg/dL (ref 6–20)
CO2: 23 mmol/L (ref 22–32)
Calcium: 9.5 mg/dL (ref 8.9–10.3)
Chloride: 100 mmol/L (ref 98–111)
Creatinine, Ser: 0.51 mg/dL (ref 0.44–1.00)
GFR, Estimated: >60 mL/min (ref >60)
Glucose, Bld: 296 mg/dL — ABNORMAL HIGH (ref 70–99)
Potassium: 3.8 mmol/L (ref 3.5–5.1)
Sodium: 134 mmol/L — ABNORMAL LOW (ref 135–145)
Total Bilirubin: 0.7 mg/dL (ref 0.3–1.2)
Total Protein: 8.1 g/dL (ref 6.5–8.1)

## 2021-12-28 LAB — HEMOGLOBIN A1C
Hgb A1c MFr Bld: 10.7 % — ABNORMAL HIGH (ref 4.8–5.6)
Mean Plasma Glucose: 260.39 mg/dL

## 2021-12-28 LAB — LIPASE, BLOOD: Lipase: 32 U/L (ref 11–51)

## 2021-12-28 SURGERY — APPENDECTOMY, LAPAROSCOPIC
Anesthesia: General | Site: Abdomen

## 2021-12-28 MED ORDER — INSULIN ASPART 100 UNIT/ML IJ SOLN
0.0000 [IU] | INTRAMUSCULAR | Status: DC
  Administered 2021-12-28: 15 [IU] via SUBCUTANEOUS

## 2021-12-28 MED ORDER — ONDANSETRON HCL 4 MG/2ML IJ SOLN
INTRAMUSCULAR | Status: DC | PRN
  Administered 2021-12-28: 4 mg via INTRAVENOUS

## 2021-12-28 MED ORDER — ROCURONIUM BROMIDE 10 MG/ML (PF) SYRINGE
PREFILLED_SYRINGE | INTRAVENOUS | Status: DC | PRN
  Administered 2021-12-28: 60 mg via INTRAVENOUS

## 2021-12-28 MED ORDER — CHLORHEXIDINE GLUCONATE 0.12 % MT SOLN: 15.0000 mL | Freq: Once | OROMUCOSAL | Status: AC

## 2021-12-28 MED ORDER — TRAMADOL HCL 50 MG PO TABS: 50.0000 mg | ORAL_TABLET | Freq: Four times a day (QID) | ORAL | 0 refills | Status: AC | PRN

## 2021-12-28 MED ORDER — BUPIVACAINE HCL 0.25 % IJ SOLN: INTRAMUSCULAR | Status: DC | PRN

## 2021-12-28 MED ORDER — LACTATED RINGERS IV BOLUS
1000.0000 mL | Freq: Once | INTRAVENOUS | Status: AC
  Administered 2021-12-28: 1000 mL via INTRAVENOUS

## 2021-12-28 MED ORDER — ORAL CARE MOUTH RINSE: 15.0000 mL | Freq: Once | OROMUCOSAL | Status: AC

## 2021-12-28 MED ORDER — METOCLOPRAMIDE HCL 5 MG/ML IJ SOLN
10.0000 mg | Freq: Once | INTRAMUSCULAR | Status: AC
  Administered 2021-12-28: 10 mg via INTRAVENOUS
  Filled 2021-12-28: qty 2

## 2021-12-28 MED ORDER — MIDAZOLAM HCL 2 MG/2ML IJ SOLN
INTRAMUSCULAR | Status: DC | PRN
  Administered 2021-12-28: 2 mg via INTRAVENOUS

## 2021-12-28 MED ORDER — ACETAMINOPHEN 325 MG PO TABS: 650.0000 mg | ORAL_TABLET | Freq: Four times a day (QID) | ORAL | Status: AC | PRN

## 2021-12-28 MED ORDER — KETOROLAC TROMETHAMINE 30 MG/ML IJ SOLN
30.0000 mg | Freq: Once | INTRAMUSCULAR | Status: AC
  Administered 2021-12-28: 30 mg via INTRAVENOUS

## 2021-12-28 MED ORDER — ONDANSETRON HCL 4 MG/2ML IJ SOLN: 4.0000 mg | Freq: Four times a day (QID) | INTRAMUSCULAR | Status: DC | PRN

## 2021-12-28 MED ORDER — FENTANYL CITRATE (PF) 250 MCG/5ML IJ SOLN
INTRAMUSCULAR | Status: AC
  Filled 2021-12-28: qty 5

## 2021-12-28 MED ORDER — INSULIN ASPART 100 UNIT/ML IJ SOLN: 0.0000 [IU] | INTRAMUSCULAR | Status: DC | PRN

## 2021-12-28 MED ORDER — PIPERACILLIN-TAZOBACTAM 3.375 G IVPB 30 MIN
3.3750 g | Freq: Once | INTRAVENOUS | Status: AC
  Administered 2021-12-28: 3.375 g via INTRAVENOUS
  Filled 2021-12-28: qty 50

## 2021-12-28 MED ORDER — CHLORHEXIDINE GLUCONATE 0.12 % MT SOLN
OROMUCOSAL | Status: AC
  Administered 2021-12-28: 15 mL via OROMUCOSAL
  Filled 2021-12-28: qty 15

## 2021-12-28 MED ORDER — ACETAMINOPHEN 160 MG/5ML PO SOLN: 325.0000 mg | ORAL | Status: DC | PRN

## 2021-12-28 MED ORDER — ACETAMINOPHEN 325 MG PO TABS
650.0000 mg | ORAL_TABLET | Freq: Four times a day (QID) | ORAL | Status: DC
  Administered 2021-12-28: 650 mg via ORAL
  Filled 2021-12-28: qty 2

## 2021-12-28 MED ORDER — 0.9 % SODIUM CHLORIDE (POUR BTL) OPTIME
TOPICAL | Status: DC | PRN
  Administered 2021-12-28: 1000 mL

## 2021-12-28 MED ORDER — ONDANSETRON 4 MG PO TBDP
4.0000 mg | ORAL_TABLET | Freq: Once | ORAL | Status: AC | PRN
  Administered 2021-12-28: 4 mg via ORAL

## 2021-12-28 MED ORDER — PROMETHAZINE HCL 25 MG/ML IJ SOLN: 6.2500 mg | INTRAMUSCULAR | Status: DC | PRN

## 2021-12-28 MED ORDER — KETOROLAC TROMETHAMINE 30 MG/ML IJ SOLN
INTRAMUSCULAR | Status: AC
  Filled 2021-12-28: qty 1

## 2021-12-28 MED ORDER — HYDROMORPHONE HCL 1 MG/ML IJ SOLN: 0.5000 mg | INTRAMUSCULAR | Status: DC | PRN

## 2021-12-28 MED ORDER — PROPOFOL 10 MG/ML IV BOLUS
INTRAVENOUS | Status: DC | PRN
  Administered 2021-12-28: 130 mg via INTRAVENOUS

## 2021-12-28 MED ORDER — FENTANYL CITRATE (PF) 100 MCG/2ML IJ SOLN: 25.0000 ug | INTRAMUSCULAR | Status: DC | PRN

## 2021-12-28 MED ORDER — TRAMADOL HCL 50 MG PO TABS: 50.0000 mg | ORAL_TABLET | Freq: Four times a day (QID) | ORAL | Status: DC | PRN

## 2021-12-28 MED ORDER — ACETAMINOPHEN 325 MG PO TABS
325.0000 mg | ORAL_TABLET | ORAL | Status: DC | PRN
  Administered 2021-12-28: 650 mg via ORAL

## 2021-12-28 MED ORDER — PIPERACILLIN-TAZOBACTAM 3.375 G IVPB: 3.3750 g | Freq: Three times a day (TID) | INTRAVENOUS | Status: DC

## 2021-12-28 MED ORDER — OXYCODONE HCL 5 MG/5ML PO SOLN: 5.0000 mg | Freq: Once | ORAL | Status: AC | PRN

## 2021-12-28 MED ORDER — LIDOCAINE 2% (20 MG/ML) 5 ML SYRINGE
INTRAMUSCULAR | Status: DC | PRN
  Administered 2021-12-28: 40 mg via INTRAVENOUS

## 2021-12-28 MED ORDER — IOHEXOL 300 MG/ML  SOLN
100.0000 mL | Freq: Once | INTRAMUSCULAR | Status: AC | PRN
  Administered 2021-12-28: 100 mL via INTRAVENOUS

## 2021-12-28 MED ORDER — PANTOPRAZOLE SODIUM 40 MG IV SOLR
40.0000 mg | Freq: Once | INTRAVENOUS | Status: AC
  Administered 2021-12-28: 40 mg via INTRAVENOUS
  Filled 2021-12-28: qty 10

## 2021-12-28 MED ORDER — SUGAMMADEX SODIUM 200 MG/2ML IV SOLN
INTRAVENOUS | Status: DC | PRN
  Administered 2021-12-28: 200 mg via INTRAVENOUS

## 2021-12-28 MED ORDER — ALUM & MAG HYDROXIDE-SIMETH 200-200-20 MG/5ML PO SUSP
30.0000 mL | Freq: Once | ORAL | Status: AC
  Administered 2021-12-28: 30 mL via ORAL
  Filled 2021-12-28: qty 30

## 2021-12-28 MED ORDER — OXYCODONE HCL 5 MG PO TABS
5.0000 mg | ORAL_TABLET | Freq: Once | ORAL | Status: AC | PRN
  Administered 2021-12-28: 5 mg via ORAL

## 2021-12-28 MED ORDER — MIDAZOLAM HCL 2 MG/2ML IJ SOLN
INTRAMUSCULAR | Status: AC
  Filled 2021-12-28: qty 2

## 2021-12-28 MED ORDER — FENTANYL CITRATE PF 50 MCG/ML IJ SOSY
100.0000 ug | PREFILLED_SYRINGE | Freq: Once | INTRAMUSCULAR | Status: AC
  Administered 2021-12-28: 100 ug via INTRAVENOUS
  Filled 2021-12-28: qty 2

## 2021-12-28 MED ORDER — ACETAMINOPHEN 10 MG/ML IV SOLN: 1000.0000 mg | Freq: Once | INTRAVENOUS | Status: DC | PRN

## 2021-12-28 MED ORDER — SODIUM CHLORIDE 0.9 % IR SOLN
Status: DC | PRN
  Administered 2021-12-28: 1

## 2021-12-28 MED ORDER — LIDOCAINE VISCOUS HCL 2 % MT SOLN
15.0000 mL | Freq: Once | OROMUCOSAL | Status: AC
  Administered 2021-12-28: 15 mL via ORAL
  Filled 2021-12-28: qty 15

## 2021-12-28 MED ORDER — FENTANYL CITRATE (PF) 250 MCG/5ML IJ SOLN
INTRAMUSCULAR | Status: DC | PRN
  Administered 2021-12-28 (×3): 50 ug via INTRAVENOUS

## 2021-12-28 MED ORDER — PROPOFOL 10 MG/ML IV BOLUS
INTRAVENOUS | Status: AC
  Filled 2021-12-28: qty 20

## 2021-12-28 MED ORDER — BUPIVACAINE-EPINEPHRINE 0.25% -1:200000 IJ SOLN
INTRAMUSCULAR | Status: DC | PRN
  Administered 2021-12-28: 30 mL

## 2021-12-28 MED ORDER — LACTATED RINGERS IV SOLN: INTRAVENOUS | Status: DC

## 2021-12-28 MED ORDER — OXYCODONE HCL 5 MG PO TABS
ORAL_TABLET | ORAL | Status: AC
  Filled 2021-12-28: qty 1

## 2021-12-28 MED ORDER — BUPIVACAINE-EPINEPHRINE (PF) 0.25% -1:200000 IJ SOLN
INTRAMUSCULAR | Status: AC
  Filled 2021-12-28: qty 30

## 2021-12-28 MED ORDER — ACETAMINOPHEN 325 MG PO TABS
ORAL_TABLET | ORAL | Status: AC
  Filled 2021-12-28: qty 2

## 2021-12-28 MED ORDER — AMISULPRIDE (ANTIEMETIC) 5 MG/2ML IV SOLN: 10.0000 mg | Freq: Once | INTRAVENOUS | Status: DC | PRN

## 2021-12-28 SURGICAL SUPPLY — 49 items
ADH SKN CLS APL DERMABOND .7 (GAUZE/BANDAGES/DRESSINGS) ×1
APL PRP STRL LF DISP 70% ISPRP (MISCELLANEOUS) ×1
APPLIER CLIP 5 13 M/L LIGAMAX5 (MISCELLANEOUS)
APR CLP MED LRG 5 ANG JAW (MISCELLANEOUS)
BAG COUNTER SPONGE SURGICOUNT (BAG) ×2 IMPLANT
BAG SPNG CNTER NS LX DISP (BAG) ×1
BLADE CLIPPER SURG (BLADE) IMPLANT
CANISTER SUCT 3000ML PPV (MISCELLANEOUS) ×2 IMPLANT
CHLORAPREP W/TINT 26 (MISCELLANEOUS) ×2 IMPLANT
CLIP APPLIE 5 13 M/L LIGAMAX5 (MISCELLANEOUS) IMPLANT
CLIP LIGATING HEMO LOK XL GOLD (MISCELLANEOUS) ×2 IMPLANT
COVER SURGICAL LIGHT HANDLE (MISCELLANEOUS) ×2 IMPLANT
COVER TRANSDUCER ULTRASND (DRAPES) ×2 IMPLANT
DERMABOND IMPLANT
DERMABOND ADVANCED .7 DNX12 (GAUZE/BANDAGES/DRESSINGS) ×2 IMPLANT
ELECT REM PT RETURN 9FT ADLT (ELECTROSURGICAL) ×1
ELECTRODE REM PT RTRN 9FT ADLT (ELECTROSURGICAL) ×2 IMPLANT
ENDOLOOP SUT PDS II  0 18 (SUTURE) ×1
ENDOLOOP SUT PDS II 0 18 (SUTURE) IMPLANT
GLOVE BIO SURGEON STRL SZ7.5 (GLOVE) ×2 IMPLANT
GLOVE SURG SYN 7.5  E (GLOVE) ×1
GLOVE SURG SYN 7.5 E (GLOVE) ×1 IMPLANT
GLOVE SURG SYN 7.5 PF PI (GLOVE) ×2 IMPLANT
GOWN STRL REUS W/ TWL LRG LVL3 (GOWN DISPOSABLE) ×4 IMPLANT
GOWN STRL REUS W/ TWL XL LVL3 (GOWN DISPOSABLE) ×2 IMPLANT
GOWN STRL REUS W/TWL LRG LVL3 (GOWN DISPOSABLE) ×2
GOWN STRL REUS W/TWL XL LVL3 (GOWN DISPOSABLE) ×1
GRASPER SUT TROCAR 14GX15 (MISCELLANEOUS) ×2 IMPLANT
KIT BASIN OR (CUSTOM PROCEDURE TRAY) ×2 IMPLANT
KIT TURNOVER KIT B (KITS) ×2 IMPLANT
NDL INSUFFLATION 14GA 120MM (NEEDLE) ×2 IMPLANT
NEEDLE INSUFFLATION 14GA 120MM (NEEDLE) ×1 IMPLANT
NS IRRIG 1000ML POUR BTL (IV SOLUTION) ×2 IMPLANT
PAD ARMBOARD 7.5X6 YLW CONV (MISCELLANEOUS) ×4 IMPLANT
SCISSORS LAP 5X35 DISP (ENDOMECHANICALS) ×2 IMPLANT
SET IRRIG TUBING LAPAROSCOPIC (IRRIGATION / IRRIGATOR) ×2 IMPLANT
SET TUBE SMOKE EVAC HIGH FLOW (TUBING) ×2 IMPLANT
SLEEVE ENDOPATH XCEL 5M (ENDOMECHANICALS) ×2 IMPLANT
SPECIMEN JAR SMALL (MISCELLANEOUS) ×2 IMPLANT
SUT MNCRL AB 4-0 PS2 18 (SUTURE) ×2 IMPLANT
TOWEL GREEN STERILE (TOWEL DISPOSABLE) ×2 IMPLANT
TOWEL GREEN STERILE FF (TOWEL DISPOSABLE) ×2 IMPLANT
TRAY FOLEY W/BAG SLVR 16FR (SET/KITS/TRAYS/PACK)
TRAY FOLEY W/BAG SLVR 16FR ST (SET/KITS/TRAYS/PACK) IMPLANT
TRAY LAPAROSCOPIC MC (CUSTOM PROCEDURE TRAY) ×2 IMPLANT
TROCAR XCEL NON-BLD 11X100MML (ENDOMECHANICALS) ×2 IMPLANT
TROCAR Z-THREAD OPTICAL 5X100M (TROCAR) ×2 IMPLANT
WARMER LAPAROSCOPE (MISCELLANEOUS) ×2 IMPLANT
WATER STERILE IRR 1000ML POUR (IV SOLUTION) ×2 IMPLANT

## 2021-12-28 NOTE — Anesthesia Preprocedure Evaluation (Signed)
Anesthesia Evaluation  Patient identified by MRN, date of birth, ID band Patient awake    Reviewed: Allergy & Precautions, NPO status , Patient's Chart, lab work & pertinent test results  Airway Mallampati: II  TM Distance: >3 FB Neck ROM: Full    Dental  (+) Teeth Intact, Dental Advisory Given   Pulmonary    breath sounds clear to auscultation       Cardiovascular hypertension, Pt. on medications  Rhythm:Regular Rate:Normal     Neuro/Psych    GI/Hepatic   Endo/Other  diabetes, Type 2, Oral Hypoglycemic Agents  Renal/GU      Musculoskeletal negative musculoskeletal ROS (+)   Abdominal (+) + obese,   Peds  Hematology negative hematology ROS (+)   Anesthesia Other Findings   Reproductive/Obstetrics                             Anesthesia Physical Anesthesia Plan  ASA: 2  Anesthesia Plan: General   Post-op Pain Management:    Induction: Intravenous  PONV Risk Score and Plan: 4 or greater and Ondansetron, Dexamethasone, Midazolam and Scopolamine patch - Pre-op  Airway Management Planned: Oral ETT  Additional Equipment: None  Intra-op Plan:   Post-operative Plan: Extubation in OR  Informed Consent: I have reviewed the patients History and Physical, chart, labs and discussed the procedure including the risks, benefits and alternatives for the proposed anesthesia with the patient or authorized representative who has indicated his/her understanding and acceptance.     Dental advisory given and Interpreter used for interveiw  Plan Discussed with: CRNA  Anesthesia Plan Comments: (BG - 350's to 270's after insulin administration, will recheck in PACU. )        Anesthesia Quick Evaluation

## 2021-12-28 NOTE — ED Triage Notes (Signed)
Patient reports pain across abdomen this evening with emesis , no fever or diarrhea .

## 2021-12-28 NOTE — Op Note (Signed)
12/28/2021  10:11 AM  PATIENT:  Nancy Romero  41 y.o. female  PRE-OPERATIVE DIAGNOSIS:  Appendicitis  POST-OPERATIVE DIAGNOSIS:  Acute Appendicitis  PROCEDURE:  Procedure(s): APPENDECTOMY LAPAROSCOPIC (N/A)  SURGEON:  Surgeon(s) and Role:    Axel Filler, MD - Primary   ANESTHESIA:   local and general  EBL:  minimal   BLOOD ADMINISTERED:none  DRAINS: none   LOCAL MEDICATIONS USED:  BUPIVICAINE   SPECIMEN:  Source of Specimen:  appendix  DISPOSITION OF SPECIMEN:  N/A  COUNTS:  YES  TOURNIQUET:  * No tourniquets in log *  DICTATION: .Dragon Dictation Complications: none  Counts: reported as correct x 2  Findings:  The patient had a acutely inflamed appendix  Specimen: Appendix  Indications for procedure:  The patient is a 41 year old female with a history of periumbilical pain localized in the right lower quadrant patient had a CT scan which revealed signs consistent with acute appendicitis the patient back in for laparoscopic appendectomy.  Details of the procedure:The patient was taken back to the operating room. The patient was placed in supine position with bilateral SCDs in place. The patient was prepped and draped in the usual sterile fashion.  After appropriate anitbiotics were confirmed, a time-out was confirmed and all facts were verified.    A pneumoperitoneum of 14 mmHg was obtained via a Veress needle technique in the left lower quadrant quadrant.  A 5 mm trocar and 5 mm camera then placed intra-abdominally there is no injury to any intra-abdominal organs a 10 mm infraumbilical port was placed and direct visualization as was a 5 mm port in the suprapubic area.   The appendix was identified and seen to be non-perforated.  The appendix was cleaned down to the appendiceal base. The mesoappendix was then incised and the appendiceal artery was cauterized.  The the appendiceal base was clean.  A gold hemoclip was placed proximallyx2 and one  distally and the appendix was transected between these 2. A retrieval bag was then placed into the abdomen and the specimen placed in the bag. The appendiceal stump was cauterized. We evacuate the fluid from the pelvis until the effluent was clear.  The appendix and retrieval  bag was then retrieved via the supraumbilical port. #1 Vicryl was used to reapproximate the fascia at the umbilical port site x2. The skin was reapproximated all port sites 3-0 Monocryl subcuticular fashion. The skin was dressed with Dermabond.  The patient was awakened from general anesthesia was taken to recovery room in stable condition.      PLAN OF CARE: Discharge to home after PACU  PATIENT DISPOSITION:  PACU - hemodynamically stable.   Delay start of Pharmacological VTE agent (>24hrs) due to surgical blood loss or risk of bleeding: not applicable

## 2021-12-28 NOTE — ED Provider Notes (Signed)
[facility] EMERGENCY DEPARTMENT Provider Note   CSN: [id number] Arrival date & time: 12/28/21  0057     History {Add pertinent medical, surgical, social history, OB history to HPI:1} Chief Complaint  Patient presents with   Abdominal Pain    Nancy Romero is a 41 y.o. female.  RUQ pain and emesis since 1900. No h/o same. No abnormal food intake. No sick contacts. No fever, diarrhea or constipation.    Abdominal Pain      Home Medications Prior to Admission medications   Medication Sig Start Date End Date Taking? Authorizing Provider  BLISOVI FE 1.5/30 1.5-30 MG-MCG tablet 2 tabs daily until no bleeding then take one daily, do not take the placebo pill 01/11/21   Hermina Staggers, MD  ferrous sulfate 325 (65 FE) MG tablet Take 1 tablet (325 mg total) by mouth every other day. 11/26/20   Adam Phenix, MD  ibuprofen (ADVIL) 800 MG tablet Take 1 tablet (800 mg total) by mouth every 8 (eight) hours as needed. 11/26/20   Adam Phenix, MD  lisinopril (ZESTRIL) 2.5 MG tablet Take 2.5 mg by mouth daily. 12/21/19   [provider]  metFORMIN (GLUCOPHAGE) 1000 MG tablet Take 1,000 mg by mouth daily with breakfast.  12/21/19   [provider]  metFORMIN (GLUCOPHAGE) 500 MG tablet Take 1 tablet (500 mg total) by mouth daily with supper. Patient not taking: No sig reported 03/18/19   Adam Phenix, MD  Multiple Vitamin (MULTIVITAMIN) tablet Take 1 tablet by mouth daily.    [provider]      Allergies    Patient has no known allergies.    Review of Systems   Review of Systems  Gastrointestinal:  Positive for abdominal pain.    Physical Exam Updated Vital Signs BP 137/86 (BP Location: Right Arm)   Pulse 70   Temp 98.5 F (36.9 C) (Oral)   Resp 20   SpO2 99%  Physical Exam Vitals and nursing note reviewed.  Constitutional:      General: She is in acute distress (secondary to pain).     Appearance: She is  well-developed.  HENT:     Head: Normocephalic and atraumatic.  Cardiovascular:     Rate and Rhythm: Normal rate and regular rhythm.  Pulmonary:     Effort: No respiratory distress.     Breath sounds: No stridor.  Abdominal:     General: There is no distension.     Tenderness: There is abdominal tenderness in the right upper quadrant. There is no guarding or rebound.  Musculoskeletal:     Cervical back: Normal range of motion.  Neurological:     Mental Status: She is alert.     ED Results / Procedures / Treatments   Labs (all labs ordered are listed, but only abnormal results are displayed) Labs Reviewed  COMPREHENSIVE METABOLIC PANEL - Abnormal; Notable for the following components:      Result Value   Sodium 134 (*)    Glucose, Bld 296 (*)    AST 60 (*)    ALT 80 (*)    All other components within normal limits  CBC - Abnormal; Notable for the following components:   WBC 19.0 (*)    Hemoglobin 15.1 (*)    All other components within normal limits  URINALYSIS, ROUTINE W REFLEX MICROSCOPIC - Abnormal; Notable for the following components:   APPearance HAZY (*)    Specific Gravity, Urine 1.038 (*)  Glucose, UA >=500 (*)    Ketones, ur 80 (*)    Bacteria, UA RARE (*)    All other components within normal limits  LIPASE, BLOOD  I-STAT BETA HCG BLOOD, ED ([facility], [facility], [facility] ONLY)    EKG None  Radiology No results found.  Procedures Procedures    Medications Ordered in ED Medications  metoCLOPramide (REGLAN) injection 10 mg (has no administration in time range)  fentaNYL (SUBLIMAZE) injection 100 mcg (has no administration in time range)  lactated ringers bolus 1,000 mL (has no administration in time range)  ondansetron (ZOFRAN-ODT) disintegrating tablet 4 mg (4 mg Oral Given 12/28/21 0114)    ED Course/ Medical Decision Making/ A&P                           Medical Decision Making Amount and/or Complexity of Data Reviewed Labs: ordered. Radiology:  ordered.  Risk Prescription drug management.  Leukocytosis, mild liver enzyme elevation, vomiting, RUQ pain concerning for acute cholecystitis.  ***  {Document critical care time when appropriate:1} {Document review of labs and clinical decision tools ie heart score, Chads2Vasc2 etc:1}  {Document your independent review of radiology images, and any outside records:1} {Document your discussion with family members, caretakers, and with consultants:1} {Document social determinants of health affecting pt's care:1} {Document your decision making why or why not admission, treatments were needed:1} Final Clinical Impression(s) / ED Diagnoses Final diagnoses:  None    Rx / DC Orders ED Discharge Orders     None

## 2021-12-28 NOTE — Discharge Instructions (Addendum)
[facility]  Please arrive at least 30 min before your appointment to complete your check in paperwork.  If you are unable to arrive 30 min prior to your appointment time we may have to cancel or reschedule you. LAPAROSCOPIC SURGERY: POST OP INSTRUCTIONS Always review your discharge instruction sheet given to you by the facility where your surgery was performed. IF YOU HAVE DISABILITY OR FAMILY LEAVE FORMS, YOU MUST BRING THEM TO THE OFFICE FOR PROCESSING.   DO NOT GIVE THEM TO YOUR DOCTOR.  PAIN CONTROL  First take acetaminophen (Tylenol) AND/or ibuprofen (Advil) to control your pain after surgery.  Follow directions on package.  Taking acetaminophen (Tylenol) and/or ibuprofen (Advil) regularly after surgery will help to control your pain and lower the amount of prescription pain medication you may need.  You should not take more than 4,000 mg (4 grams) of acetaminophen (Tylenol) in 24 hours.  You should not take ibuprofen (Advil), aleve, motrin, naprosyn or other NSAIDS if you have a history of stomach ulcers or chronic kidney disease.  A prescription for pain medication may be given to you upon discharge.  Take your pain medication as prescribed, if you still have uncontrolled pain after taking acetaminophen (Tylenol) or ibuprofen (Advil). Use ice packs to help control pain. If you need a refill on your pain medication, please contact your pharmacy.  They will contact our office to request authorization. Prescriptions will not be filled after 5pm or on week-ends.  HOME MEDICATIONS Take your usually prescribed medications unless otherwise directed.  DIET You should follow a light diet the first few days after arrival home.  Be sure to include lots of fluids daily. Avoid fatty, fried foods.   CONSTIPATION It is common to experience some constipation after surgery and if you are taking pain medication.  Increasing fluid intake and taking a stool softener (such as Colace)  will usually help or prevent this problem from occurring.  A mild laxative (Milk of Magnesia or Miralax) should be taken according to package instructions if there are no bowel movements after 48 hours.  WOUND/INCISION CARE Most patients will experience some swelling and bruising in the area of the incisions.  Ice packs will help.  Swelling and bruising can take several days to resolve.  Unless discharge instructions indicate otherwise, follow guidelines below  STERI-STRIPS - you may remove your outer bandages 48 hours after surgery, and you may shower at that time.  You have steri-strips (small skin tapes) in place directly over the incision.  These strips should be left on the skin for 7-10 days.   DERMABOND/SKIN GLUE - you may shower in 24 hours.  The glue will flake off over the next 2-3 weeks. Any sutures or staples will be removed at the office during your follow-up visit.  ACTIVITIES You may resume regular (light) daily activities beginning the next day--such as daily self-care, walking, climbing stairs--gradually increasing activities as tolerated.  You may have sexual intercourse when it is comfortable.  Refrain from any heavy lifting or straining until approved by your doctor. You may drive when you are no longer taking prescription pain medication, you can comfortably wear a seatbelt, and you can safely maneuver your car and apply brakes.  FOLLOW-UP You should see your doctor in the office for a follow-up appointment approximately 2-3 weeks after your surgery.  You should have been given your post-op/follow-up appointment when your surgery was scheduled.  If you did not receive a post-op/follow-up appointment, make sure   that you call for this appointment within a day or two after you arrive home to insure a convenient appointment time. ° ° °WHEN TO CALL YOUR DOCTOR: °Fever over 101.0 °Inability to urinate °Continued bleeding from incision. °Increased pain, redness, or drainage from the  incision. °Increasing abdominal pain ° °The clinic staff is available to answer your questions during regular business hours.  Please don’t hesitate to call and ask to speak to one of the nurses for clinical concerns.  If you have a medical emergency, go to the nearest emergency room or call 911.  A surgeon from [facility] is always on call at the hospital. °[address], [address], [place], [place]  [address] ? [address] [address], [place], [place]   [address] °[phone] ? [phone] ? FAX [phone] ° ° ° ° °Managing Your Pain After Surgery Without Opioids ° ° ° °Thank you for participating in our program to help patients manage their pain after surgery without opioids. This is part of our effort to provide you with the best care possible, without exposing you or your family to the risk that opioids pose. ° °What pain can I expect after surgery? °You can expect to have some pain after surgery. This is normal. The pain is typically worse the day after surgery, and quickly begins to get better. °Many studies have found that many patients are able to manage their pain after surgery with Over-the-Counter (OTC) medications such as Tylenol and Motrin. If you have a condition that does not allow you to take Tylenol or Motrin, notify your surgical team. ° °How will I manage my pain? °The best strategy for controlling your pain after surgery is around the clock pain control with Tylenol (acetaminophen) and Motrin (ibuprofen or Advil). Alternating these medications with each other allows you to maximize your pain control. In addition to Tylenol and Motrin, you can use heating pads or ice packs on your incisions to help reduce your pain. ° °How will I alternate your regular strength over-the-counter pain medication? °You will take a dose of pain medication every three hours. °Start by taking 650 mg of Tylenol (2 pills of 325 mg) °3 hours later take 600 mg of Motrin (3 pills of 200 mg) °3 hours after  taking the Motrin take 650 mg of Tylenol °3 hours after that take 600 mg of Motrin. ° ° °- 1 - ° °See example - if your first dose of Tylenol is at 12:00 PM ° ° °12:00 PM Tylenol 650 mg (2 pills of 325 mg)  °3:00 PM Motrin 600 mg (3 pills of 200 mg)  °6:00 PM Tylenol 650 mg (2 pills of 325 mg)  °9:00 PM Motrin 600 mg (3 pills of 200 mg)  °Continue alternating every 3 hours  ° °We recommend that you follow this schedule around-the-clock for at least 3 days after surgery, or until you feel that it is no longer needed. Use the table on the last page of this handout to keep track of the medications you are taking. °Important: °Do not take more than 3000mg of Tylenol or 3200mg of Motrin in a 24-hour period. °Do not take ibuprofen/Motrin if you have a history of bleeding stomach ulcers, severe kidney disease, &/or actively taking a blood thinner ° °What if I still have pain? °If you have pain that is not controlled with the over-the-counter pain medications (Tylenol and Motrin or Advil) you might have what we call “breakthrough” pain. You will receive a prescription   for a small amount of an opioid pain medication such as Oxycodone, Tramadol, or Tylenol with Codeine. Use these opioid pills in the first 24 hours after surgery if you have breakthrough pain. Do not take more than 1 pill every 4-6 hours.  If you still have uncontrolled pain after using all opioid pills, don't hesitate to call our staff using the number provided. We will help make sure you are managing your pain in the best way possible, and if necessary, we can provide a prescription for additional pain medication.   Day 1    Time  Name of Medication Number of pills taken  Amount of Acetaminophen  Pain Level   Comments  AM PM       AM PM       AM PM       AM PM       AM PM       AM PM       AM PM       AM PM       Total Daily amount of Acetaminophen Do not take more than  3,000 mg per day      Day 2    Time  Name of Medication  Number of pills taken  Amount of Acetaminophen  Pain Level   Comments  AM PM       AM PM       AM PM       AM PM       AM PM       AM PM       AM PM       AM PM       Total Daily amount of Acetaminophen Do not take more than  3,000 mg per day      Day 3    Time  Name of Medication Number of pills taken  Amount of Acetaminophen  Pain Level   Comments  AM PM       AM PM       AM PM       AM PM         AM PM       AM PM       AM PM       AM PM       Total Daily amount of Acetaminophen Do not take more than  3,000 mg per day      Day 4    Time  Name of Medication Number of pills taken  Amount of Acetaminophen  Pain Level   Comments  AM PM       AM PM       AM PM       AM PM       AM PM       AM PM       AM PM       AM PM       Total Daily amount of Acetaminophen Do not take more than  3,000 mg per day      Day 5    Time  Name of Medication Number of pills taken  Amount of Acetaminophen  Pain Level   Comments  AM PM       AM PM       AM PM       AM PM       AM PM       AM PM         AM PM       AM PM       Total Daily amount of Acetaminophen Do not take more than  3,000 mg per day      Day 6    Time  Name of Medication Number of pills taken  Amount of Acetaminophen  Pain Level  Comments  AM PM       AM PM       AM PM       AM PM       AM PM       AM PM       AM PM       AM PM       Total Daily amount of Acetaminophen Do not take more than  3,000 mg per day      Day 7    Time  Name of Medication Number of pills taken  Amount of Acetaminophen  Pain Level   Comments  AM PM       AM PM       AM PM       AM PM       AM PM       AM PM       AM PM       AM PM       Total Daily amount of Acetaminophen Do not take more than  3,000 mg per day        For additional information about how and where to safely dispose of unused opioid medications - [email or web address]  Disclaimer: This document contains  information and/or instructional materials adapted from [facility] for the typical patient with your condition. It does not replace medical advice from your health care provider because your experience may differ from that of the typical patient. Talk to your health care provider if you have any questions about this document, your condition or your treatment plan. Adapted from [facility]   CIRUGIA LAPAROSCOPICA: INSTRUCCIONES DE POST OPERATORIO.  Revise siempre los documentos que le entreguen en el lugar donde se ha hecho la [place].  SI USTED NECESITA DOCUMENTOS DE INCAPACIDAD (DISABLE) O DE PERMISO FAMILAR (FAMILY LEAVE) NECESITA TRAERLOS A LA OFICINA PARA QUE SEAN PROCESADOS. NO  SE LOS DE A SU DOCTOR. A su alta del hospital se le dara una receta para Human resources officer. Tomela como ha sido recetada, si la necesita. Si no la necesita puede tomar, Acetaminofen (Tylenol) o Ibuprofen (Advil) para aliviar dolor moderado. Continue tomando el resto de sus medicinas. Si necesita rellenar la receta, llame a la farmacia. ellos contactan a nuestra oficina pidiendo autorizacion. Este tipo de receta no pueden ser [employer] de las  5pm o [employer] fines de [place]. Con relacion a la dieta: debe ser [employer] primeros dias despues que llege a la casa. Ejemplo: sopas y galleticas. Tome bastante liquido esos dias. [employer] de los pacientes padecen de inflamacion y cambio de coloracion de la piel alrededor de las incisiones. esto toma dias en resolver.  pnerse una bolsa de hielo en el area affectada ayuda..  Es comun tambien tener un poco de estrenimiento si esta tomado medicinas para Chief Technology Officer. incremente la cantidad de liquidos a tomar y Engineer, production (Colace) esto previene el problema. Si ya tiene estrenimiento, es Designer, jewellery no ha defecado en 48 horas, puede tomar un laxativo (Milk of Magnesia or Miralax) uselo como el paquete le explica.  A menos que se le diga algo  diferente. Remueva el  bendaje a las 24-48 horas despues dela [place]. y puede banarse en la ducha sin ningun problema. usted puede tener steri-strips (pequenas curitas transparentes en la piel puesta encima de la incision)  Estas banditas strips should be left on the skin for 7-10 days.   Si su cirujano puso pegamento encima de la incision usted puede banarse bajo la ducha en 24 horas. Este pegamento empezara a caerse en las proximas 2-3 semanas. Si le pusieron suturas o presillas (grapos) estos seran quitados en su proxima cita en la oficina. . ACTIVIDADES:  Puede hacer actividad ligera.  Como caminar , subir escaleras y poco a poco irlas incrementando tanto como las [place]. Puede tener relaciones sexuales cuando sea comfortable. No carge objetos pesados o haga esfuerzos que no sean aprovados por su doctor. Puede manejar en cuanto no esta tomando medicamentos fuertes (narcoticos) para Chief Technology Officer, pueda abrochar confortablemente el cinturon de seguridad, y pueda Personnel officer y usar los pedales de su vehiculo con seguridad. PUEDE REGRESAR A TRABAJAR  Debe ver a su doctor para una cita de seguimiento en 2-3 semanas despues de la [place].  OTRAS ISNSTRUCCIONES:___________________________________________________________________________________ Debbora Lacrosse A SU MEDICO: FIEBRE mayor de  101.0 No produccion de [place]. Sangramiento continue de la herida Incremento de Engineer, mining, enrojecimientio o drenaje de la herida (incision) Incremento de dolor abdominal.  The clinic staff is available to answer your questions during regular business hours.  Please don't hesitate to call and ask to speak to one of the nurses for clinical concerns.  If you have a medical emergency, go to the nearest emergency room or call 911.  A surgeon from [facility] is always on call at the hospital. [address], [address], [place], [place]  [address] ? [address] [address], [place], [place]   [address] [phone] ? [phone] ? [phone]  [phone] Web site: [email or web address]

## 2021-12-28 NOTE — Transfer of Care (Signed)
Immediate Anesthesia Transfer of Care Note  Patient: Nancy Romero  Procedure(s) Performed: APPENDECTOMY LAPAROSCOPIC (Abdomen)  Patient Location: PACU  Anesthesia Type:General  Level of Consciousness: awake, alert  and oriented  Airway & Oxygen Therapy: Patient Spontanous Breathing  Post-op Assessment: Report given to RN and Post -op Vital signs reviewed and stable  Post vital signs: Reviewed and stable  Last Vitals:  Vitals Value Taken Time  BP 146/113 12/28/21 1016  Temp    Pulse 85 12/28/21 1019  Resp 20 12/28/21 1019  SpO2 96 % 12/28/21 1019  Vitals shown include unvalidated device data.  Last Pain:  Vitals:   12/28/21 0847  TempSrc:   PainSc: 0-No pain         Complications: No notable events documented.

## 2021-12-28 NOTE — H&P (Signed)
Surgical Evaluation  Chief Complaint: Abdominal pain  HPI: Very pleasant 41 year old woman with history of diabetes on metformin, hypertension, presents with abdominal pain that began around 7 PM yesterday.  This was in the lower abdomen, worse on the right side.  She reports associated nausea and emesis, denies fever, denies any change in bowel movements.  She denies any previous abdominal surgery.  Currently she reports her pain is little bit better.  No Known Allergies  Past Medical History:  Diagnosis Date   Anemia    recieved IV iron 1 year ago    Blood transfusion without reported diagnosis    1 year ago   Diabetes mellitus without complication (HCC)    Type 2 -x 3 years pre diabetic-diagnosed and taking medication x 6 months    Past Surgical History:  Procedure Laterality Date   CESAREAN SECTION      x 2    Family History  Problem Relation Age of Onset   Alcohol abuse Neg Hx    Arthritis Neg Hx    Asthma Neg Hx    Birth defects Neg Hx    Cancer Neg Hx    COPD Neg Hx    Depression Neg Hx    Diabetes Neg Hx    Drug abuse Neg Hx    Early death Neg Hx    Hearing loss Neg Hx    Heart disease Neg Hx    Hyperlipidemia Neg Hx    Hypertension Neg Hx    Kidney disease Neg Hx    Learning disabilities Neg Hx    Mental illness Neg Hx    Mental retardation Neg Hx    Miscarriages / Stillbirths Neg Hx    Stroke Neg Hx    Vision loss Neg Hx    Varicose Veins Neg Hx     Social History   Socioeconomic History   Marital status: Significant Other    Spouse name: Not on file   Number of children: 2   Years of education: Not on file   Highest education level: Not on file  Occupational History   Occupation: Network engineer  Tobacco Use   Smoking status: Never   Smokeless tobacco: Never  Vaping Use   Vaping Use: Never used  Substance and Sexual Activity   Alcohol use: No   Drug use: No   Sexual activity: Not Currently  Other Topics Concern   Not on file  Social  History Narrative   Originally from [place]   Moved to [place]. In 2004   Lives with husband and 2 children   Cleans houses   Social Determinants of Health   Financial Resource Strain: Not on file  Food Insecurity: Food Insecurity Present (01/11/2021)   Hunger Vital Sign    Worried About Running Out of Food in the Last Year: Sometimes true    Ran Out of Food in the Last Year: Never true  Transportation Needs: No Transportation Needs (01/11/2021)   PRAPARE - Administrator, Civil Service (Medical): No    Lack of Transportation (Non-Medical): No  Physical Activity: Not on file  Stress: Not on file  Social Connections: Not on file    No current facility-administered medications on file prior to encounter.   Current Outpatient Medications on File Prior to Encounter  Medication Sig Dispense Refill   BLISOVI FE 1.5/30 1.5-30 MG-MCG tablet 2 tabs daily until no bleeding then take one daily, do not take the placebo pill 28 tablet 6  ferrous sulfate 325 (65 FE) MG tablet Take 1 tablet (325 mg total) by mouth every other day. 30 tablet 3   ibuprofen (ADVIL) 800 MG tablet Take 1 tablet (800 mg total) by mouth every 8 (eight) hours as needed. 60 tablet 1   lisinopril (ZESTRIL) 2.5 MG tablet Take 2.5 mg by mouth daily.     metFORMIN (GLUCOPHAGE) 1000 MG tablet Take 1,000 mg by mouth daily with breakfast.      metFORMIN (GLUCOPHAGE) 500 MG tablet Take 1 tablet (500 mg total) by mouth daily with supper. (Patient not taking: No sig reported) 30 tablet 7   Multiple Vitamin (MULTIVITAMIN) tablet Take 1 tablet by mouth daily.      Review of Systems: a complete, 10pt review of systems was completed with pertinent positives and negatives as documented in the HPI  Physical Exam: Vitals:   12/28/21 0430 12/28/21 0443  BP: 120/74   Pulse: 72   Resp: 19   Temp:  97.9 F (36.6 C)  SpO2: 92%    Gen: A&Ox3, no distress  Eyes: lids and conjunctivae normal, no icterus. Pupils equally round  and reactive to light.  Neck: supple without mass or thyromegaly Chest: respiratory effort is normal. No crepitus or tenderness on palpation of the chest. Breath sounds equal.  Cardiovascular: RRR with palpable distal pulses, no pedal edema Gastrointestinal: soft, nondistended, tender with voluntary guarding in the right lower quadrant Muscoloskeletal: no clubbing or cyanosis of the fingers.  Strength is symmetrical throughout.  Range of motion of bilateral upper and lower extremities normal without pain, crepitation or contracture. Neuro: cranial nerves grossly intact.  Sensation intact to light touch diffusely. Psych: appropriate mood and affect, normal insight/judgment intact  Skin: warm and dry      Latest Ref Rng & Units 12/28/2021    1:21 AM 11/26/2020   11:50 AM 02/08/2020    5:43 PM  CBC  WBC 4.0 - 10.5 K/uL 19.0  10.0  8.2   Hemoglobin 12.0 - 15.0 g/dL 78.5  9.6  9.1   Hematocrit 36.0 - 46.0 % 44.8  30.3  29.9   Platelets 150 - 400 K/uL 233  348  343        Latest Ref Rng & Units 12/28/2021    1:21 AM 12/17/2016    1:48 PM  CMP  Glucose 70 - 99 mg/dL 885  027   BUN 6 - 20 mg/dL 6  9   Creatinine 7.41 - 1.00 mg/dL 2.87  8.67   Sodium 672 - 145 mmol/L 134  136   Potassium 3.5 - 5.1 mmol/L 3.8  3.3   Chloride 98 - 111 mmol/L 100  108   CO2 22 - 32 mmol/L 23  22   Calcium 8.9 - 10.3 mg/dL 9.5  8.8   Total Protein 6.5 - 8.1 g/dL 8.1  7.2   Total Bilirubin 0.3 - 1.2 mg/dL 0.7  0.5   Alkaline Phos 38 - 126 U/L 99  77   AST 15 - 41 U/L 60  22   ALT 0 - 44 U/L 80  21     No results found for: "INR", "PROTIME"  Imaging: CT ABDOMEN PELVIS W CONTRAST  Result Date: 12/28/2021 CLINICAL DATA:  Abdominal pain with vomiting. EXAM: CT ABDOMEN AND PELVIS WITH CONTRAST TECHNIQUE: Multidetector CT imaging of the abdomen and pelvis was performed using the standard protocol following bolus administration of intravenous contrast. RADIATION DOSE REDUCTION: This exam was performed according  to the departmental  dose-optimization program which includes automated exposure control, adjustment of the mA and/or kV according to patient size and/or use of iterative reconstruction technique. CONTRAST:  OMNIPAQUE IOHEXOL 300 MG/ML  SOLN COMPARISON:  No comparison studies available. FINDINGS: Lower chest: Unremarkable. Hepatobiliary: No suspicious focal abnormality within the liver parenchyma. There is no evidence for gallstones, gallbladder wall thickening, or pericholecystic fluid. No intrahepatic or extrahepatic biliary dilation. Pancreas: No focal mass lesion. No dilatation of the main duct. No intraparenchymal cyst. No peripancreatic edema. Spleen: No splenomegaly. No focal mass lesion. Adrenals/Urinary Tract: No adrenal nodule or mass. Kidneys unremarkable. No evidence for hydroureter. The urinary bladder appears normal for the degree of distention. Stomach/Bowel: Stomach is unremarkable. No gastric wall thickening. No evidence of outlet obstruction. Duodenum is normally positioned as is the ligament of Treitz. No small bowel wall thickening. No small bowel dilatation. The terminal ileum is normal. The appendix measures 14 mm diameter (axial 69/3) with ill-defined hyperemic wall and periappendiceal edema/inflammation appendiceal lumen is diffusely fluid-filled. Possible tiny calcified appendicolith on sagittal image 48/series 7. No gross colonic mass. No colonic wall thickening. Vascular/Lymphatic: No abdominal aortic aneurysm. No abdominal aortic atherosclerotic calcification. There is no gastrohepatic or hepatoduodenal ligament lymphadenopathy. No retroperitoneal or mesenteric lymphadenopathy. No pelvic sidewall lymphadenopathy. Reproductive: Unremarkable. Other: No intraperitoneal free fluid. Musculoskeletal: No worrisome lytic or sclerotic osseous abnormality. IMPRESSION: Imaging features consistent with acute appendicitis. No evidence for perforation or abscess. Electronically Signed   By: Kennith Center M.D.   On: 12/28/2021 05:09   US Abdomen Limited RUQ (LIVER/GB)  Result Date: 12/28/2021 CLINICAL DATA:  Right upper quadrant pain EXAM: ULTRASOUND ABDOMEN LIMITED RIGHT UPPER QUADRANT COMPARISON:  None Available. FINDINGS: Gallbladder: No gallstones, gallbladder wall thickening, or pericholecystic fluid. Negative sonographic Murphy's sign. Common bile duct: Diameter: 5 mm Liver: Hyperechoic hepatic parenchyma, suggesting hepatic steatosis. No focal hepatic lesion is seen. Portal vein is patent on color Doppler imaging with normal direction of blood flow towards the liver. Other: None. IMPRESSION: Hepatic steatosis. Electronically Signed   By: Charline Bills M.D.   On: 12/28/2021 03:41     A/P: Acute appendicitis. I recommend proceeding with laparoscopic appendectomy. We discussed the surgery including relevant anatomy and technique, risks of bleeding, infection, pain, scarring, injury to intra-abdominal structures, conversion to open surgery or more extensive resection, risk of staple line leak or delayed abscess, failure to resolve symptoms, postoperative ileus, incisional hernia, as well as general risks of DVT/PE, pneumonia, stroke, heart attack, death. Questions were welcomed and answered to the patient's satisfaction.  We will plan for surgery this morning with Dr. Derrell Lolling, potential discharge home postop depending on intraoperative findings.     Patient Active Problem List   Diagnosis Date Noted   Lymphocytosis 12/12/2019   Albuminuria 12/12/2019   Type 2 diabetes mellitus (HCC) 10/24/2017   Elevated liver enzymes 10/06/2017   Acanthosis nigricans 10/06/2017   Dyspareunia in female 10/06/2017   Abnormal uterine bleeding (AUB) 12/24/2014   Iron deficiency anemia 11/18/2014   Language barrier, speaks Spanish only 10/30/2013   Obesity, Class III, BMI 40-49.9 (morbid obesity) (HCC) 10/30/2013   Chronic female pelvic pain 10/30/2013       Phylliss Blakes, MD [facility]  See AMION to contact appropriate on-call provider

## 2021-12-28 NOTE — Anesthesia Postprocedure Evaluation (Signed)
Anesthesia Post Note  Patient: Nancy Romero  Procedure(s) Performed: APPENDECTOMY LAPAROSCOPIC (Abdomen)     Patient location during evaluation: PACU Anesthesia Type: General Level of consciousness: awake and alert Pain management: pain level controlled Vital Signs Assessment: post-procedure vital signs reviewed and stable Respiratory status: spontaneous breathing, nonlabored ventilation, respiratory function stable and patient connected to nasal cannula oxygen Cardiovascular status: blood pressure returned to baseline and stable Postop Assessment: no apparent nausea or vomiting Anesthetic complications: no   No notable events documented.  Last Vitals:  Vitals:   12/28/21 1100 12/28/21 1115  BP: 121/77 112/67  Pulse: 78 73  Resp: 19 17  Temp:  36.9 C  SpO2: 98% 94%    Last Pain:  Vitals:   12/28/21 1115  TempSrc:   PainSc: 4                  Shelton Silvas

## 2021-12-28 NOTE — ED Notes (Signed)
Pt changed out of clothes into gown and belongings placed into bag for OR.

## 2021-12-28 NOTE — Anesthesia Procedure Notes (Signed)
Procedure Name: Intubation Date/Time: 12/28/2021 9:35 AM  Performed by: Griffin Dakin, CRNAPre-anesthesia Checklist: Patient identified, Emergency Drugs available, Suction available and Patient being monitored Patient Re-evaluated:Patient Re-evaluated prior to induction Oxygen Delivery Method: Circle system utilized Preoxygenation: Pre-oxygenation with 100% oxygen Induction Type: IV induction Ventilation: Mask ventilation without difficulty Laryngoscope Size: 4 and Mac Grade View: Grade II Tube type: Oral Tube size: 7.0 mm Number of attempts: 1 Airway Equipment and Method: Stylet and Oral airway Placement Confirmation: ETT inserted through vocal cords under direct vision, positive ETCO2 and breath sounds checked- equal and bilateral Secured at: 23 cm Tube secured with: Tape Dental Injury: Teeth and Oropharynx as per pre-operative assessment

## 2021-12-29 ENCOUNTER — Encounter (HOSPITAL_COMMUNITY): Payer: Self-pay | Admitting: General Surgery

## 2021-12-29 LAB — SURGICAL PATHOLOGY

## 2022-01-15 ENCOUNTER — Other Ambulatory Visit: Payer: Self-pay

## 2022-01-15 ENCOUNTER — Emergency Department (HOSPITAL_COMMUNITY)
Admission: EM | Admit: 2022-01-15 | Discharge: 2022-01-16 | Disposition: A | Discharge status: Home / Self Care | Payer: 59 | Attending: Emergency Medicine | Admitting: Emergency Medicine

## 2022-01-15 DIAGNOSIS — K0889 Other specified disorders of teeth and supporting structures: Secondary | ICD-10-CM

## 2022-01-15 NOTE — ED Triage Notes (Signed)
Pt here for L side dental pain since last night. Pt attempted to call dentist but office was not open, pt denies fevers, nausea.

## 2022-01-15 NOTE — ED Provider Triage Note (Signed)
Emergency Medicine Provider Triage Evaluation Note  Nancy Romero , a 41 y.o. female  was evaluated in triage.  Pt complains of lower left-sided jaw pain.  Patient states the pain began last night.  Pain is located in the region of the molars.  Patient denies any fevers or nausea.  States she tried calling the dentist earlier today but due to it being Saturday none were open.  Denies drooling, difficulty swallowing, sore throat  Review of Systems  Positive: As above Negative: As above  Physical Exam  BP (!) 144/92 (BP Location: Right Arm)   Pulse 88   Temp 98.9 F (37.2 C) (Oral)   Resp 18   LMP 11/23/2021   SpO2 99%  Gen:   Awake, no distress   Resp:  Normal effort  MSK:   Moves extremities without difficulty  Other:    Medical Decision Making  Medically screening exam initiated at 10:22 PM.  Appropriate orders placed.  [place] was informed that the remainder of the evaluation will be completed by another provider, this initial triage assessment does not replace that evaluation, and the importance of remaining in the ED until their evaluation is complete.     Dorothyann Peng, PA-C 01/15/22 2224

## 2022-01-16 MED ORDER — AMOXICILLIN-POT CLAVULANATE 875-125 MG PO TABS: 1.0000 | ORAL_TABLET | Freq: Two times a day (BID) | ORAL | 0 refills | Status: AC

## 2022-01-16 MED ORDER — HYDROCODONE-ACETAMINOPHEN 5-325 MG PO TABS: 2.0000 | ORAL_TABLET | Freq: Four times a day (QID) | ORAL | 0 refills | Status: AC | PRN

## 2022-01-16 NOTE — Discharge Instructions (Addendum)
Your exam today is overall reassuring.  No evidence of an abscess.  I have sent antibiotic into the pharmacy for you along with short course of pain medication for you to keep on hand for severe or breakthrough pain.  Please call your dentist to schedule a follow-up appointment.  If you do not have 1 I have attached our on-call dentist information above for you.  For any concerning symptoms such as difficulty swallowing, difficulty breathing please return to the emergency room.

## 2022-01-16 NOTE — ED Provider Notes (Signed)
[facility] EMERGENCY DEPARTMENT Provider Note   CSN: [id number] Arrival date & time: 01/15/22  2208     History  Chief Complaint  Patient presents with   Dental Pain    Nancy Romero is a 41 y.o. female.  41 year old female presents today for evaluation of left-sided dental pain going on for 2 days.  She denies fever, facial swelling, drainage, difficulty swallowing, voice change.  She called her dentist on Saturday but they were not open.  The history is provided by the patient. No language interpreter was used.       Home Medications Prior to Admission medications   Medication Sig Start Date End Date Taking? Authorizing Provider  amoxicillin-clavulanate (AUGMENTIN) 875-125 MG tablet Take 1 tablet by mouth every 12 (twelve) hours. 01/16/22  Yes Deatra Canter, [place]ar, PA-C  HYDROcodone-acetaminophen (NORCO/VICODIN) 5-325 MG tablet Take 2 tablets by mouth every 6 (six) hours as needed for severe pain. 01/16/22  Yes Zoria Rawlinson, PA-C  atorvastatin (LIPITOR) 20 MG tablet Take 20 mg by mouth at bedtime. 12/09/21   [provider]  lisinopril (ZESTRIL) 2.5 MG tablet Take 2.5 mg by mouth daily. 12/21/19   [provider]  metFORMIN (GLUCOPHAGE) 500 MG tablet Take 1 tablet (500 mg total) by mouth daily with supper. Patient taking differently: Take 1,000 mg by mouth 2 (two) times daily with a meal. 03/18/19   Woodroe Mode, MD  Multiple Vitamin (MULTIVITAMIN) tablet Take 1 tablet by mouth daily.    [provider]      Allergies    Patient has no known allergies.    Review of Systems   Review of Systems  Constitutional:  Negative for chills and fever.  HENT:  Positive for dental problem. Negative for drooling, trouble swallowing and voice change.   All other systems reviewed and are negative.   Physical Exam Updated Vital Signs BP 113/83 (BP Location: Right Arm)   Pulse 71   Temp 98.2 F (36.8 C) (Oral)   Resp 18   LMP 11/23/2021    SpO2 99%  Physical Exam Vitals and nursing note reviewed.  Constitutional:      General: She is not in acute distress.    Appearance: Normal appearance. She is not ill-appearing.  HENT:     Head: Normocephalic and atraumatic.     Nose: Nose normal.     Mouth/Throat:     Pharynx: Oropharynx is clear. Uvula midline.     Tonsils: No tonsillar exudate or tonsillar abscesses.     Comments: Without evidence of facial swelling.  No evidence of peritonsillar abscess, retropharyngeal abscess, Ludwick's angina.  No evidence of dental abscess. Eyes:     Conjunctiva/sclera: Conjunctivae normal.  Pulmonary:     Effort: Pulmonary effort is normal. No respiratory distress.  Musculoskeletal:        General: No deformity.  Skin:    Findings: No rash.  Neurological:     Mental Status: She is alert.     ED Results / Procedures / Treatments   Labs (all labs ordered are listed, but only abnormal results are displayed) Labs Reviewed - No data to display  EKG None  Radiology No results found.  Procedures Procedures    Medications Ordered in ED Medications - No data to display  ED Course/ Medical Decision Making/ A&P                           Medical Decision  Making Risk Prescription drug management.   41 year old female presents today for evaluation of dental pain.  This is left-sided.  Without evidence of dental abscess, retropharyngeal abscess, Ludwick's angina, or peritonsillar abscess.  Normal voice.  Afebrile.  Dental resources provided.  She states she does have a dentist that she will attempt to call tomorrow.  I have provided her with on-call dentist information.  Symptomatic management discussed.  Return precautions discussed.  Antibiotic and short course of pain medication provided.  Patient is appropriate for discharge.  Discharged in stable condition.  Return precautions discussed.   Final Clinical Impression(s) / ED Diagnoses Final diagnoses:  Pain, dental    Rx /  DC Orders ED Discharge Orders          Ordered    amoxicillin-clavulanate (AUGMENTIN) 875-125 MG tablet  Every 12 hours        01/16/22 0901    HYDROcodone-acetaminophen (NORCO/VICODIN) 5-325 MG tablet  Every 6 hours PRN        01/16/22 0901              Evlyn Courier, PA-C 01/16/22 0908    Deno Etienne, DO 01/16/22 1134

## 2023-04-06 ENCOUNTER — Encounter: Payer: Self-pay | Admitting: Family Medicine
